# Patient Record
Sex: Female | Born: 1960 | Race: White | Hispanic: No | Marital: Married | Smoking: Never smoker
Health system: Southern US, Community
[De-identification: ages and names within clinical notes are randomized; demographics above are authoritative.]

## PROBLEM LIST (undated history)

## (undated) DIAGNOSIS — R32 Unspecified urinary incontinence: Secondary | ICD-10-CM

## (undated) DIAGNOSIS — K2100 Gastro-esophageal reflux disease with esophagitis, without bleeding: Secondary | ICD-10-CM

## (undated) DIAGNOSIS — I10 Essential (primary) hypertension: Secondary | ICD-10-CM

## (undated) DIAGNOSIS — R7309 Other abnormal glucose: Secondary | ICD-10-CM

## (undated) DIAGNOSIS — E119 Type 2 diabetes mellitus without complications: Secondary | ICD-10-CM

## (undated) DIAGNOSIS — D219 Benign neoplasm of connective and other soft tissue, unspecified: Secondary | ICD-10-CM

## (undated) DIAGNOSIS — E039 Hypothyroidism, unspecified: Secondary | ICD-10-CM

## (undated) DIAGNOSIS — Z85828 Personal history of other malignant neoplasm of skin: Secondary | ICD-10-CM

## (undated) DIAGNOSIS — N814 Uterovaginal prolapse, unspecified: Secondary | ICD-10-CM

## (undated) DIAGNOSIS — E785 Hyperlipidemia, unspecified: Secondary | ICD-10-CM

## (undated) DIAGNOSIS — Z8589 Personal history of malignant neoplasm of other organs and systems: Secondary | ICD-10-CM

## (undated) DIAGNOSIS — IMO0002 Reserved for concepts with insufficient information to code with codable children: Secondary | ICD-10-CM

## (undated) HISTORY — DX: Uterovaginal prolapse, unspecified: N81.4

## (undated) HISTORY — DX: Type 2 diabetes mellitus without complications: E11.9

## (undated) HISTORY — PX: BILATERAL SALPINGECTOMY: SHX5743

## (undated) HISTORY — DX: Benign neoplasm of connective and other soft tissue, unspecified: D21.9

## (undated) HISTORY — DX: Other abnormal glucose: R73.09

## (undated) HISTORY — DX: Hyperlipidemia, unspecified: E78.5

## (undated) HISTORY — DX: Hypothyroidism, unspecified: E03.9

## (undated) HISTORY — DX: Essential (primary) hypertension: I10

## (undated) HISTORY — DX: Unspecified urinary incontinence: R32

## (undated) HISTORY — DX: Gastro-esophageal reflux disease with esophagitis: K21.0

## (undated) HISTORY — DX: Reserved for concepts with insufficient information to code with codable children: IMO0002

## (undated) HISTORY — DX: Gastro-esophageal reflux disease with esophagitis, without bleeding: K21.00

---
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
## 1999-08-05 ENCOUNTER — Other Ambulatory Visit: Admission: RE | Admit: 1999-08-05 | Discharge: 1999-08-05 | Payer: Self-pay | Admitting: Obstetrics and Gynecology

## 2000-09-15 ENCOUNTER — Other Ambulatory Visit: Admission: RE | Admit: 2000-09-15 | Discharge: 2000-09-15 | Payer: Self-pay | Admitting: Obstetrics and Gynecology

## 2001-09-21 ENCOUNTER — Other Ambulatory Visit: Admission: RE | Admit: 2001-09-21 | Discharge: 2001-09-21 | Payer: Self-pay | Admitting: Obstetrics and Gynecology

## 2002-11-28 ENCOUNTER — Other Ambulatory Visit: Admission: RE | Admit: 2002-11-28 | Discharge: 2002-11-28 | Payer: Self-pay | Admitting: Obstetrics and Gynecology

## 2003-06-02 DIAGNOSIS — Z85828 Personal history of other malignant neoplasm of skin: Secondary | ICD-10-CM

## 2003-06-02 HISTORY — DX: Personal history of other malignant neoplasm of skin: Z85.828

## 2004-08-27 ENCOUNTER — Ambulatory Visit: Payer: Self-pay | Admitting: Internal Medicine

## 2005-01-08 ENCOUNTER — Ambulatory Visit: Payer: Self-pay | Admitting: Internal Medicine

## 2006-01-14 ENCOUNTER — Ambulatory Visit: Payer: Self-pay | Admitting: Internal Medicine

## 2006-01-26 ENCOUNTER — Ambulatory Visit: Payer: Self-pay | Admitting: Internal Medicine

## 2006-06-01 DIAGNOSIS — R739 Hyperglycemia, unspecified: Secondary | ICD-10-CM

## 2006-06-01 DIAGNOSIS — R7309 Other abnormal glucose: Secondary | ICD-10-CM

## 2006-06-01 HISTORY — DX: Other abnormal glucose: R73.09

## 2006-06-01 HISTORY — DX: Hyperglycemia, unspecified: R73.9

## 2007-02-21 ENCOUNTER — Ambulatory Visit: Payer: Self-pay | Admitting: Internal Medicine

## 2007-02-24 ENCOUNTER — Ambulatory Visit: Payer: Self-pay | Admitting: Internal Medicine

## 2007-08-02 DIAGNOSIS — Z8589 Personal history of malignant neoplasm of other organs and systems: Secondary | ICD-10-CM

## 2007-08-02 HISTORY — DX: Personal history of malignant neoplasm of other organs and systems: Z85.89

## 2007-11-08 ENCOUNTER — Encounter: Payer: Self-pay | Admitting: Obstetrics and Gynecology

## 2007-11-08 HISTORY — PX: VAGINAL HYSTERECTOMY: SUR661

## 2007-11-09 ENCOUNTER — Inpatient Hospital Stay (HOSPITAL_COMMUNITY): Admission: AD | Admit: 2007-11-09 | Discharge: 2007-11-10 | Payer: Self-pay | Admitting: Urology

## 2008-02-08 ENCOUNTER — Ambulatory Visit: Payer: Self-pay | Admitting: Internal Medicine

## 2008-02-27 ENCOUNTER — Ambulatory Visit: Payer: Self-pay | Admitting: Internal Medicine

## 2009-03-04 ENCOUNTER — Ambulatory Visit: Payer: Self-pay | Admitting: Internal Medicine

## 2010-03-06 ENCOUNTER — Ambulatory Visit: Payer: Self-pay | Admitting: Internal Medicine

## 2010-03-18 ENCOUNTER — Ambulatory Visit: Payer: Self-pay | Admitting: Internal Medicine

## 2010-03-28 ENCOUNTER — Ambulatory Visit: Payer: Self-pay | Admitting: Internal Medicine

## 2010-04-09 ENCOUNTER — Ambulatory Visit: Payer: Self-pay | Admitting: Internal Medicine

## 2010-10-14 NOTE — H&P (Signed)
NAME:  Carmen [name], Carmen [name] NO.:  [id number]   MEDICAL RECORD NO.:  [id number]          PATIENT TYPE:  INP   LOCATION:  1421                         FACILITY:  [facility]   PHYSICIAN:  Martina Sinner, MD DATE OF BIRTH:  1961/02/07   DATE OF ADMISSION:  11/08/2007  DATE OF DISCHARGE:                              HISTORY & PHYSICAL   ADMISSION DIAGNOSIS:  Pelvic organ prolapse; cystocele and vault  suspension; postoperative retention and fatigue.   HISTORY:  Carmen [name] has pelvic organ prolapse.  She had an uneventful  transvaginal hysterectomy, vault suspension and cystocele repair.  She  did very well from a surgical standpoint.  She looked good the morning  after surgery.  Her hemoglobin was normal.  Her vitals were normal.  She  had good urine output.  She had a failed trial of voiding and had  abdominal discomfort that was relieved when a catheter was placed.  She  was scanned and catheterized for 1100 mL.   She had a normal abdominal examination post-catheter insertion.  Incision looked fine.  She is not toxic.  She looks very fatigued and I  do not think she has a high pain tolerance.  I think she is also little  bit anxious and I thought it was best to keep her in for a day of  observation.   PHYSICAL EXAMINATION:  VITAL SIGNS:  Normal, pulse regular.  SKIN:  Warm and dry.  No rashes.  RESPIRATORY:  Breath is quiet.  ABDOMEN:  Soft, nontender.  GU:  Foley catheter in place draining well and clear urine.  NEUROLOGIC:  Normal sensation to touch.  MUSCULOSKELETAL:  Normal leg and motor strength.  She is walking with  small steps with the help of the nurse.  LYMPHATICS:  No inguinal adenopathy.   PLAN:  Carmen [name] was admitted for 24 hours more of observation.  I  suspect she will go home tomorrow.  I kept her IV in at 70 cc an hour.  She will go home on ciprofloxacin and Vicodin.  She will follow up on  Monday for a trial of voiding.     ______________________________  Martina Sinner, MD  Electronically Signed     SAM/MEDQ  D:  11/09/2007  T:  11/09/2007  Job:  [id number]

## 2010-10-14 NOTE — Op Note (Signed)
NAME:  Carmen, [name] NO.:  [id number]   MEDICAL RECORD NO.:  [id number]          PATIENT TYPE:  AMB   LOCATION:  DAY                          FACILITY:  [facility]   PHYSICIAN:  Cynthia P. Romine, M.D.DATE OF BIRTH:  01-07-1961   DATE OF PROCEDURE:  11/08/2007  DATE OF DISCHARGE:                               OPERATIVE REPORT   PREOPERATIVE DIAGNOSIS:  Pelvic prolapse.   POSTOPERATIVE DIAGNOSIS:  Pelvic prolapse.   PROCEDURE:  Total vaginal hysterectomy.   SURGEON:  Cynthia P. Romine, M.D.   ASSISTANT:  Dr. Leda Quail.   ANESTHESIA:  General endotracheal.   ESTIMATED BLOOD LOSS:  150 mL.   COMPLICATIONS:  None.   PROCEDURE:  The patient was taken to the operating room and, after  induction of adequate general anesthesia, was placed in dorsal lithotomy  position and prepped and draped in usual fashion.  The bladder was  drained with a latex-free catheter.  A posterior weighted and anterior  Sims retractor were placed.  The cervix was grasped on its anterior lip  with a single-tooth tenaculum.  The cervix did protrude out through the  introitus with traction on the cervix with the tenaculum.  The mucosa  overlying the cervix was injected with 1% Xylocaine with epinephrine  approximately 6 mL.  A knife was used to incise the mucosa off the  cervix from 9 o'clock to 3 o'clock that was pushed anteriorly.  Next  attention was turned posteriorly.  A posterior colpotomy incision was  made and the Bonanno retractor placed.  Uterosacral ligaments were  clamped, cut and held bilaterally.  The hysterectomy continued up the  cardinal ligament, clamping, cutting and tying in sequence.  The  anterior peritoneum was identified and entered atraumatically and Deaver  retractor was placed into the anterior peritoneal space.  Proper  placement was reaffirmed by noting bowel visualized.  The pedicle  containing the uterine arteries was clamped, cut and tied bilaterally.  One more pedicle was taken up the broad ligament and then the pedicle  containing the utero-ovarian tube and round ligament was clamped, cut  and doubly tied on both sides.  Specimens removed and sent to pathology.  The pedicles were inspected and  were felt to be free of bleeding.  The  ovaries were inspected and were also normal.  There was a small,  approximately 1 cm, cyst on the left ovary that appeared functional.  The cuff was then run with 0 Vicryl from 1 o'clock to 11 o'clock,  incorporated vaginal cuff and the peritoneum.  An anti-enterocele stitch  was placed, ligating the uterosacral ligaments together in the midline  posteriorly and then Dr. Lorin Picket  MacDiarmid came in to perform the surgery for the bladder prolapse.  This will be dictated separately.  After he completed the bladder  repair, I did close the vaginal cuff with several figure-of-eight  sutures of 0 Vicryl and good hemostasis was noted.      Cynthia P. Romine, M.D.  Electronically Signed     CPR/MEDQ  D:  11/08/2007  T:  11/08/2007  Job:  [id number]

## 2010-10-14 NOTE — Op Note (Signed)
NAME:  Carmen [name], Carmen [name] NO.:  [id number]   MEDICAL RECORD NO.:  [id number]          PATIENT TYPE:  AMB   LOCATION:  DAY                          FACILITY:  [facility]   PHYSICIAN:  Martina Sinner, MD DATE OF BIRTH:  Oct 21, 1960   DATE OF PROCEDURE:  11/08/2007  DATE OF DISCHARGE:                               OPERATIVE REPORT   PREOPERATIVE DIAGNOSES:  Uterine vault prolapse, plus cystocele, plus  rectocele plus stress incontinence.   POSTOPERATIVE DIAGNOSES:  Uterine vault prolapse, plus cystocele, plus  rectocele plus stress incontinence.   SURGERY:  Transvaginal vault suspension, plus cystocele repair, plus  graft, plus sling, cystourethropexy (bracket SPARC) plus cystoscopy.   PRIMARY SURGEON:  Dr. Lorin Picket MacDiarmid   ASSISTANT:  Dr. Purvis Sheffield.   Transvaginal hysterectomy performed by Dr. Tresa Res and assistant Dr.  Leda Quail.   Carmen [name] has vaginal vault prolapse, uterine prolapse with a large  cystocele.  She consented to the above surgery.  She underwent a  transvaginal hysterectomy by Dr. Tresa Res and Dr. Hyacinth Meeker.  The cuff was  opened when I entered the operating room.  Blood loss was minimal.  The  posterior cuff had been run with absorbable suture.   Her vaginal cuff descended to 2-3 cm from the introitus.  I placed 2  Allis clamps on the cuff lateral to the midline and instilled 20 mL of a  lidocaine/epinephrine mixture submucosally all the way to the pelvic  sidewall even to the level of the urethrovesical angle.  I made a long  anterior midline incision, making the incision thicker underlying the  urethra where the synthetic sling would be placed.  I sharply dissected  the vaginal mucosa from the underlying pubocervical fascia and also used  blunt dissection to reach the endopelvic fascia bilaterally.  There was  very minimal bleeding.  I did an anterior repair with running 2-0 Vicryl  suture, not encroaching on the bladder neck.  I then  cystoscoped the  patient, and there was efflux of indigo carmine from both ureters, a  nice cystocele reduction, and no distortion of anatomy or injury to the  bladder or urethra.   I made two 1 cm incisions 1 fingerbreadth above the symphysis pubis 1.5  cm lateral to the midline.  With the bladder emptied, I passed the [facility]  needle on top of and along the back of the symphysis pubis parallel to  the midline on the pulp of my index finger bilaterally at the level of  the urethrovesical angle.  I then cystoscoped the patient.  There was no  injury to the bladder or urethra.  There was no movement of the bladder  with deflection of the needle.  There was bilateral efflux of indigo  carmine.   With the bladder emptied, the sling was attached and brought up through  the retropubic space, and it was left in situ with the sheaths in place.   With the bladder empty, I then finger-dissected to the ischial spine  bilaterally.  She had prominent spines bilaterally.  The sacrospinous  ligament  was easily palpable, especially on her left side and less so on  the right side.  I used the cath healing 0 Ethibond suture and placed a  suture approximately 1 cm medial and inferior and cephalad to the  ischial spine bilaterally.  I was very pleased with the position of the  suture, and I checked it on a number of occasions.  There was little to  no bleeding.   I placed 2 sutures in the pelvic sidewall, approaching the  urethrovesical angle but not overlapping the sling of 0 Ethibond using a  CT-2 needle.  I cut a 10 x six dermal sling that was soaked with saline  to the shape of a trapezoid.  It was sewn to the 4 sutures using an  empty needle driver and also a Mayo needle.  The dermis laid in very  nicely under appropriate tension and did not encroach the sling.   The sling was tensioned over the fat part of the large Kelly clamp, and  I placed it on the looser side because it was in the proximal  third of  urethra which it usually is when one opens up the entire anterior  vaginal wall.  She also has mild stress incontinence and primary urge  incontinence.   I trimmed approximately 8 mm of anterior vaginal wall mucosa from both  sides and closed the anterior vaginal wall with running 2-0 Vicryl on a  CT-1 needle all the way back to the cuff.   Dr. Tresa Res then closed the cuff with 4 interrupted figure-of-eight 0  Vicryl sutures.   We irrigated the vagina and checked the repair.  She had excellent  vaginal length.  She had very good reduction of the cystocele.  She had  a flat posterior vaginal wall and very minimal rectocele on digital  rectal examination.  There was no injury or suture in the rectum.   Two vaginal packs with Estrace cream were firmly packed in the vagina.  Estimated blood loss for the vault suspension was approximately 100 mL.  Leg position was good.  Catheter was draining blue urine at the end of  the case.   I cut the sling below the skin and closed the skin with 4-0 Monocryl  followed by Dermabond.   I was very pleased with Ms. Raz' surgery.  I hope we will reach her  treatment goal and she will get a good durable response in regard to her  prolapse as well as her incontinence.  I am a bit concerned about her  overactive bladder.           ______________________________  Martina Sinner, MD  Electronically Signed     SAM/MEDQ  D:  11/08/2007  T:  11/08/2007  Job:  [id number]

## 2010-11-17 ENCOUNTER — Ambulatory Visit: Payer: Self-pay | Admitting: Internal Medicine

## 2011-02-26 LAB — COMPREHENSIVE METABOLIC PANEL
ALT: 19
AST: 21
Alkaline Phosphatase: 46
CO2: 33 — ABNORMAL HIGH
Calcium: 9.5
GFR calc Af Amer: >60
Potassium: 3.9
Sodium: 138
Total Protein: 7.6

## 2011-02-26 LAB — CBC
HCT: 32.9 — ABNORMAL LOW
Hemoglobin: 11.6 — ABNORMAL LOW
MCHC: 35.8
Platelets: 201
RBC: 3.8 — ABNORMAL LOW
RBC: 4.48
RDW: 13.1
WBC: 11.1 — ABNORMAL HIGH
WBC: 7

## 2011-02-26 LAB — PREGNANCY, URINE: Preg Test, Ur: NEGATIVE

## 2011-02-26 LAB — TYPE AND SCREEN
ABO/RH(D): O NEG
Antibody Screen: NEGATIVE

## 2011-02-26 LAB — ABO/RH: ABO/RH(D): O NEG

## 2011-04-03 ENCOUNTER — Emergency Department: Payer: Self-pay | Admitting: Emergency Medicine

## 2011-04-07 ENCOUNTER — Ambulatory Visit: Payer: Self-pay | Admitting: Internal Medicine

## 2011-04-16 ENCOUNTER — Ambulatory Visit: Payer: Self-pay | Admitting: Internal Medicine

## 2011-05-14 ENCOUNTER — Ambulatory Visit: Payer: Self-pay | Admitting: Unknown Physician Specialty

## 2011-09-01 ENCOUNTER — Ambulatory Visit: Payer: Self-pay | Admitting: Internal Medicine

## 2011-09-30 ENCOUNTER — Ambulatory Visit: Payer: Self-pay | Admitting: Internal Medicine

## 2011-10-31 ENCOUNTER — Ambulatory Visit: Payer: Self-pay | Admitting: Internal Medicine

## 2012-04-19 ENCOUNTER — Ambulatory Visit: Payer: Self-pay | Admitting: Family Medicine

## 2012-10-11 ENCOUNTER — Encounter: Payer: Self-pay | Admitting: Obstetrics and Gynecology

## 2012-10-12 ENCOUNTER — Ambulatory Visit (INDEPENDENT_AMBULATORY_CARE_PROVIDER_SITE_OTHER): Payer: Managed Care, Other (non HMO) | Admitting: Obstetrics and Gynecology

## 2012-10-12 ENCOUNTER — Encounter: Payer: Self-pay | Admitting: Obstetrics and Gynecology

## 2012-10-12 VITALS — BP 110/70 | HR 64 | Resp 16 | Ht 64.5 in | Wt 132.0 lb

## 2012-10-12 DIAGNOSIS — Z0000 Encounter for general adult medical examination without abnormal findings: Secondary | ICD-10-CM

## 2012-10-12 DIAGNOSIS — Z01419 Encounter for gynecological examination (general) (routine) without abnormal findings: Secondary | ICD-10-CM

## 2012-10-12 LAB — POCT URINALYSIS DIPSTICK
Bilirubin, UA: NEGATIVE
Glucose, UA: NEGATIVE
Ketones, UA: NEGATIVE

## 2012-10-12 NOTE — Patient Instructions (Signed)

## 2012-10-12 NOTE — Progress Notes (Signed)
52 y.o.  Married  Caucasian female   G1P1001 here for annual exam.  A1C's running about 5.4.  Pt walking for exercise.   No LMP recorded. Patient has had a hysterectomy.          Sexually active: yes  The current method of family planning is none.    Exercising: yes Last mammogram:04/19/12 normal   Last pap smear:11/28/02 History of abnormal pap: no Smoking:no Alcohol:no Last colonoscopy:05/2011 normal recheck in 5 years due to Fhx Last Bone Density: n/a  Last tetanus shot:2010 (pt unsure) Last cholesterol check: 12/2011   Hgb: 13.1               Urine: Normal    Health Maintenance  Topic Date Due  . Pap Smear  03/31/1979  . Mammogram  03/31/2011  . Colonoscopy  03/31/2011  . Tetanus/tdap  06/02/2011  . Influenza Vaccine  01/30/2013    No family history on file.  There are no active problems to display for this patient.   Past Medical History  Diagnosis Date  . Uterine prolapse   . Cystocele   . Urinary incontinence   . Diabetes   . Hyperlipidemia   . Blood sugar increased 2008  . Hypothyroid     Hashimoto's  thyroid  . Hypertension   . Fibroid   . Uterine prolapse     Past Surgical History  Procedure Laterality Date  . Vaginal hysterectomy  11/08/07    Total; Cystocele repair; valut suspension. adenomycosis fibroid  . Bilateral salpingectomy      Allergies: Atenolol and Latex  Current Outpatient Prescriptions  Medication Sig Dispense Refill  . calcium carbonate (OS-CAL) 600 MG TABS Take 600 mg by mouth 2 (two) times daily with a meal.      . esomeprazole (NEXIUM) 40 MG packet Take 40 mg by mouth daily before breakfast.      . fexofenadine (ALLEGRA) 180 MG tablet Take 180 mg by mouth daily.      . fluticasone (FLONASE) 50 MCG/ACT nasal spray Place 2 sprays into the nose daily.      Marland Kitchen levothyroxine (LEVOXYL) 50 MCG tablet Take 50 mcg by mouth daily before breakfast.      . losartan (COZAAR) 50 MG tablet Take 50 mg by mouth daily.      . metFORMIN  (GLUCOPHAGE) 500 MG tablet Take 500 mg by mouth 2 (two) times daily with a meal.      . Multiple Vitamins-Minerals (MULTIVITAMIN PO) Take by mouth.      . Omega-3 Fatty Acids (FISH OIL) 1000 MG CAPS Take by mouth.      . triamterene-hydrochlorothiazide (MAXZIDE-25) 37.5-25 MG per tablet Take 1 tablet by mouth daily.       No current facility-administered medications for this visit.    ROS: Pertinent items are noted in HPI.  Social Hx:  Married, one son Fayrene Fearing in college at [employer].    Exam:    BP 110/70  Pulse 64  Resp 16  Ht 5' 4.5" (1.638 m)  Wt 132 lb (59.875 kg)  BMI 22.32 kg/m2Down 4 pounds from last year   Wt Readings from Last 3 Encounters:  10/12/12 132 lb (59.875 kg)     Ht Readings from Last 3 Encounters:  10/12/12 5' 4.5" (1.638 m)    General appearance: alert, cooperative and appears stated age Head: Normocephalic, without obvious abnormality, atraumatic Neck: no adenopathy, supple, symmetrical, trachea midline and thyroid not enlarged, symmetric, no tenderness/mass/nodules Lungs:  clear to auscultation bilaterally Breasts: Inspection negative, No nipple retraction or dimpling, No nipple discharge or bleeding, No axillary or supraclavicular adenopathy, Normal to palpation without dominant masses Heart: regular rate and rhythm Abdomen: soft, non-tender; bowel sounds normal; no masses,  no organomegaly Extremities: extremities normal, atraumatic, no cyanosis or edema Skin: Skin color, texture, turgor normal. No rashes or lesions Lymph nodes: Cervical, supraclavicular, and axillary nodes normal. No abnormal inguinal nodes palpated Neurologic: Grossly normal   Pelvic: External genitalia:  no lesions              Urethra:  normal appearing urethra with no masses, tenderness or lesions              Bartholins and Skenes: normal                 Vagina: normal appearing vagina with normal color and discharge, no lesions, Grade 1 cystocele has recurred               Cervix: absent              Pap taken: no        Bimanual Exam:  Uterus:  absent                                      Adnexa: normal adnexa in size, nontender and no masses                                      Rectovaginal: Confirms                                      Anus:  normal sphincter tone, no lesions  A: normal gyn exam     S/p TVH, cystocele repair, vault suspension 2009; mild cystocele on today's exam, pt a-[id number]     AODM, HTN, elevated TG     hypothyroids post Hashimoto's thyroiditis     P: mammogram counseled on breast self exam, mammography screening, adequate intake of calcium and vitamin D, diet and exercise return annually or prn     An After Visit Summary was printed and given to the patient.

## 2012-10-13 LAB — HEMOGLOBIN, FINGERSTICK: Hemoglobin, fingerstick: 13.1 g/dL (ref 12.0–16.0)

## 2013-01-09 ENCOUNTER — Telehealth: Payer: Self-pay | Admitting: Obstetrics and Gynecology

## 2013-01-09 NOTE — Telephone Encounter (Signed)
Patient is having hot flashes .

## 2013-01-09 NOTE — Telephone Encounter (Signed)
Patient is having hot flashes, would like to talk to a nurse.

## 2013-01-09 NOTE — Telephone Encounter (Signed)
Spoke with pt about hot flashes that have gotten much worse over the last few weeks. Up at night sometimes every hour. Pt states, "Dr. Tresa Res said I would know when I needed to call her." Sched consult tomorrow at 10:30.

## 2013-01-10 ENCOUNTER — Ambulatory Visit (INDEPENDENT_AMBULATORY_CARE_PROVIDER_SITE_OTHER): Payer: Managed Care, Other (non HMO) | Admitting: Obstetrics and Gynecology

## 2013-01-10 ENCOUNTER — Encounter: Payer: Self-pay | Admitting: Obstetrics and Gynecology

## 2013-01-10 VITALS — BP 132/80 | Ht 64.25 in | Wt 129.0 lb

## 2013-01-10 DIAGNOSIS — N951 Menopausal and female climacteric states: Secondary | ICD-10-CM

## 2013-01-10 MED ORDER — ESTRADIOL 0.1 MG/24HR TD PTTW: 1.0000 | MEDICATED_PATCH | TRANSDERMAL | Status: DC

## 2013-01-10 NOTE — Progress Notes (Signed)
52 yo MWF G1P1 who over the past several months has had increasing hot flashes and night sweats to the point that she had a hot flash last night every hour and couldn't sleep almost at all.  Pt is exhausted and really wants help.  Last check on diabetes and thyroid was in Feb and both were good.  Pt has had a hysterectomy.    Discussed risks and benefits of ERT with pt, and I rec that in her situation at her age and with such bad symptoms, I believe ERT is safe and in her best interest.  Risks of blood clotting, breast and ovarian cancer discussed.  Pt accepts and wishes to start.  Educated in Minivelle 0.1 mg 2 times per week and rx sent.  Questions invited and answered.

## 2013-01-10 NOTE — Patient Instructions (Signed)
Begin Minivelle as directed, twice a week.  Keep appointment for routine care.  Return sooner is symptoms do not improve with treatment.

## 2013-03-10 ENCOUNTER — Ambulatory Visit: Payer: Self-pay | Admitting: Family Medicine

## 2013-04-20 ENCOUNTER — Ambulatory Visit: Payer: Self-pay | Admitting: Family Medicine

## 2013-09-08 ENCOUNTER — Encounter: Payer: Self-pay | Admitting: Obstetrics and Gynecology

## 2013-09-08 ENCOUNTER — Ambulatory Visit (INDEPENDENT_AMBULATORY_CARE_PROVIDER_SITE_OTHER): Payer: Managed Care, Other (non HMO) | Admitting: Obstetrics and Gynecology

## 2013-09-08 VITALS — BP 100/64 | HR 70 | Ht 64.5 in | Wt 139.2 lb

## 2013-09-08 DIAGNOSIS — K219 Gastro-esophageal reflux disease without esophagitis: Secondary | ICD-10-CM

## 2013-09-08 DIAGNOSIS — R1013 Epigastric pain: Secondary | ICD-10-CM

## 2013-09-08 DIAGNOSIS — N951 Menopausal and female climacteric states: Secondary | ICD-10-CM

## 2013-09-08 DIAGNOSIS — K3189 Other diseases of stomach and duodenum: Secondary | ICD-10-CM

## 2013-09-08 NOTE — Progress Notes (Signed)
Patient ID: Carmen [name], female   DOB: 08/23/1960, 53 y.o.   MRN: [id number] GYNECOLOGY VISIT  PCP:   [place] Pink, MD  Referring provider:   HPI: 53 y.o.   Married  Caucasian  female   G1P1001 with No LMP recorded. Patient has had a hysterectomy.   here consult regarding HRT.   Had consultation for severe hot flashes last summer Started Minivelle patch 0.1 mg twice weekly.  In October developed severe reflux.  Saw PCP.  Started dexilant. Did endoscopy with GI. H Pylori was negative.  Went to [facility] for a second opinion. Was severe enough to cause hoarseness of voice.   In December cut the [facility] in half and did not have hot flashes but did not get relief of reflux.   Stopped Minivelle in March, off now for one month. Reflux has improved somewhat.  No resumption of hot flashes.   GYNECOLOGIC HISTORY: No LMP recorded. Patient has had a hysterectomy. Sexually active:  yes Partner preference: female Contraception: postmenopausal   Menopausal hormone therapy: no DES exposure:   no Blood transfusions:  no  Sexually transmitted diseases:   no GYN procedures and prior surgeries: TVH/cystocele repair 2009.  Last mammogram:  04/2013 LEX:[id number]               Last pap and high risk HPV testing: 11-19-02 wnl   History of abnormal pap smear:  no   OB History   Grav Para Term Preterm Abortions TAB SAB Ect Mult Living   1 1 1       1        LIFESTYLE: Exercise: walking              Tobacco: no Alcohol:    no Drug use:  no  There are no active problems to display for this patient.   Past Medical History  Diagnosis Date  . Uterine prolapse   . Cystocele   . Urinary incontinence   . Diabetes   . Hyperlipidemia   . Blood sugar increased 2008  . Hypothyroid     Hashimoto's  thyroid  . Hypertension   . Fibroid   . Uterine prolapse   . Reflux esophagitis     Past Surgical History  Procedure Laterality Date  . Vaginal hysterectomy  11/08/07     Total; Cystocele repair; valut suspension. adenomycosis fibroid  . Bilateral salpingectomy      Current Outpatient Prescriptions  Medication Sig Dispense Refill  . calcium carbonate (OS-CAL) 600 MG TABS Take 600 mg by mouth 2 (two) times daily with a meal.      . fexofenadine (ALLEGRA) 180 MG tablet Take 180 mg by mouth daily.      . fluticasone (FLONASE) 50 MCG/ACT nasal spray Place 2 sprays into the nose daily.      Marland Kitchen levothyroxine (LEVOXYL) 50 MCG tablet Take 50 mcg by mouth daily before breakfast.      . losartan (COZAAR) 50 MG tablet Take 50 mg by mouth daily.      . metFORMIN (GLUCOPHAGE) 500 MG tablet Take 500 mg by mouth 2 (two) times daily with a meal.      . Multiple Vitamins-Minerals (MULTIVITAMIN PO) Take by mouth.      . triamterene-hydrochlorothiazide (MAXZIDE-25) 37.5-25 MG per tablet Take 1 tablet by mouth daily.      Marland Kitchen estradiol (MINIVELLE) 0.1 MG/24HR patch Place 1 patch (0.1 mg total) onto the skin 2 (two) times a week. Apply anywhere on  lower abdomen.  Change patch on Sun am and Wed pm.  24 patch  3   No current facility-administered medications for this visit.     ALLERGIES: Atenolol and Latex  Family History  Problem Relation Age of Onset  . Thyroid disease Mother   . Diabetes Father   . Hypertension Father   . Stroke Father     History   Social History  . Marital Status: Married    Spouse Name: N/A    Number of Children: N/A  . Years of Education: N/A   Occupational History  . Not on file.   Social History Main Topics  . Smoking status: Never Smoker   . Smokeless tobacco: Never Used  . Alcohol Use: No  . Drug Use: No  . Sexual Activity: Yes    Partners: Male    Birth Control/ Protection: Post-menopausal   Other Topics Concern  . Not on file   Social History Narrative  . No narrative on file    ROS:  Pertinent items are noted in HPI.  PHYSICAL EXAMINATION:    BP 100/64  Pulse 70  Ht 5' 4.5" (1.638 m)  Wt 139 lb 3.2 oz (63.141  kg)  BMI 23.53 kg/m2   Wt Readings from Last 3 Encounters:  09/08/13 139 lb 3.2 oz (63.141 kg)  01/10/13 129 lb (58.514 kg)  10/12/12 132 lb (59.875 kg)     Ht Readings from Last 3 Encounters:  09/08/13 5' 4.5" (1.638 m)  01/10/13 5' 4.25" (1.632 m)  10/12/12 5' 4.5" (1.638 m)    General appearance: alert, cooperative and appears stated age   ASSESSMENT  Menopausal symptoms. Dyspepsia on transdermal estrogen therapy.   PLAN  Patient and I reviewed potential gastrointestinal symptoms of ERT and dyspepsia is listed, although not a common side effect observed. She will stop the estrogen completely. We discussed herbal remedies, which she will pursue.  We discussed Brisdelle and Effexor as options if her symptoms are not controlled with complementary medicine options.   An After Visit Summary was printed and given to the patient.  25 minutes face to face of which over 50% was spent in counseling.

## 2013-09-08 NOTE — Patient Instructions (Signed)
Paroxetine capsules What is this medicine? PAROXETINE (pa ROX e teen) is used to treat hot flashes due to menopause. This medicine may be used for other purposes; ask your health care provider or pharmacist if you have questions. COMMON BRAND NAME(S): Brisdelle What should I tell my health care provider before I take this medicine? They need to know if you have any of these conditions: -bleeding disorders -glaucoma -heart disease -kidney disease -liver disease -low levels of sodium in the blood -mania or bipolar disorder -seizures -suicidal thoughts, plans, or attempt; a previous suicide attempt by you or a family member -take MAOIs like Carbex, Eldepryl, Marplan, Nardil, and Parnate -take medicines that treat or prevent blood clots -an unusual or allergic reaction to paroxetine, other medicines, foods, dyes, or preservatives -pregnant or trying to get pregnant -breast-feeding How should I use this medicine? Take this medicine by mouth once daily at bedtime. Follow the directions on the prescription label. This medicine can be taken with or without food. Take your medicine at regular intervals. Do not take your medicine more often than directed. A special MedGuide will be given to you by the pharmacist with each prescription and refill. Be sure to read this information carefully each time. Overdosage: If you think you've taken too much of this medicine contact a poison control center or emergency room at once. Overdosage: If you think you have taken too much of this medicine contact a poison control center or emergency room at once. NOTE: This medicine is only for you. Do not share this medicine with others. What if I miss a dose? If you miss a dose, take it as soon as you can. If it is almost time for your next dose, take only that dose. Do not take double or extra doses. What may interact with this medicine? Do not take this medicine with any of the following  medications: -linezolid -MAOIs like Carbex, Eldepryl, Marplan, Nardil, and Parnate -methylene blue (injected into a vein) -pimozide -thioridazine This medicine may also interact with the following medications: -alcohol -aspirin and aspirin-like medicines -atomoxetine -certain medicines for depression, anxiety, or psychotic disturbances -certain medicines for irregular heart beat like propafenone, flecainide, encainide, and quinidine -certain medicines for migraine headache like almotriptan, eletriptan, frovatriptan, naratriptan, rizatriptan, sumatriptan, zolmitriptan -cimetidine -digoxin -diuretics -fentanyl -fosamprenavir -furazolidone -isoniazid -lithium -medicines that treat or prevent blood clots like warfarin, enoxaparin, and dalteparin -medicines for sleep -NSAIDs, medicines for pain and inflammation, like ibuprofen or naproxen -phenobarbital -phenytoin -procarbazine -rasagiline -ritonavir -supplements like St. John's wort, kava kava, valerian -tamoxifen -tramadol -tryptophan This list may not describe all possible interactions. Give your health care provider a list of all the medicines, herbs, non-prescription drugs, or dietary supplements you use. Also tell them if you smoke, drink alcohol, or use illegal drugs. Some items may interact with your medicine. What should I watch for while using this medicine? Tell your doctor or healthcare professional if your symptoms do not start to get better or if they get worse. Visit your doctor or health care professional for regular checks on your progress. Patients and their families should watch out for new or worsening thoughts of suicide or depression. Also watch out for sudden changes in feelings such as feeling anxious, agitated, panicky, irritable, hostile, aggressive, impulsive, severely restless, overly excited and hyperactive, or not being able to sleep. If this happens, especially at the beginning of treatment or after a  change in dose, call your health care professional. You may get drowsy or dizzy. Do   not drive, use machinery, or do anything that needs mental alertness until you know how this medicine affects you. Do not stand or sit up quickly, especially if you are an older patient. This reduces the risk of dizzy or fainting spells. Alcohol may interfere with the effect of this medicine. Avoid alcoholic drinks. Your mouth may get dry. Chewing sugarless gum, sucking hard candy and drinking plenty of water will help. Contact your doctor if the problem does not go away or is severe. Women should inform their doctor if they wish to become pregnant or think they might be pregnant. There is a potential for serious side effects to an unborn child. Talk to your health care professional or pharmacist for more information. Do not become pregnant while taking this medicine. What side effects may I notice from receiving this medicine? Side effects that you should report to your doctor or health care professional as soon as possible: -allergic reactions like skin rash, itching or hives, swelling of the face, lips, or tongue -changes in emotions or moods -confusion -depression -feeling faint or lightheaded, falls -seizures -suicidal thoughts or actions -unusual bleeding or bruising -unusually weak or tired -weakness Side effects that usually do not require medical attention (Report these to your doctor or health care professional if they continue or are bothersome.): -change in sex drive or performance -fatigue -drowsiness -headache -insomnia -nausea/vomiting -upset stomach This list may not describe all possible side effects. Call your doctor for medical advice about side effects. You may report side effects to FDA at 1-800-FDA-1088. Where should I keep my medicine? Keep out of the reach of children. Store at room temperature between 20 and 25 degrees C (68 and 77 degrees F). Throw away any unused medicine after  the expiration date. NOTE: This sheet is a summary. It may not cover all possible information. If you have questions about this medicine, talk to your doctor, pharmacist, or health care provider.  2014, [employer]vier/Gold Standard. (2012-01-18 12:26:17)  Menopause and Herbal Products Menopause is the normal time of life when menstrual periods stop completely. Menopause is complete when you have missed 12 consecutive menstrual periods. It usually occurs between the ages of 77 to 68, with an average age of 74. Very rarely does a woman develop menopause before 53 years old. At menopause, your ovaries stop producing the female hormones, estrogen and progesterone. This can cause undesirable symptoms and also affect your health. Sometimes the symptoms can occur 4 to 5 years before the menopause begins. There is no relationship between menopause and:  Oral contraceptives.  Number of children you had.  Race.  The age your menstrual periods started (menarche). Heavy smokers and very thin women may develop menopause earlier in life. Estrogen and progesterone hormone treatment is the usual method of treating menopausal symptoms. However, there are women who should not take hormone treatment. This is true of:   Women that have breast or uterine cancer.  Women who prefer not to take hormones because of certain side effects (abnormal uterine bleeding).  Women who are afraid that hormones may cause breast cancer.  Women who have a history of liver disease, heart disease, stroke, or blood clots. For these women, there are other medications that may help treat their menopausal symptoms. These medications are found in plants and botanical products. They can be found in the form of herbs, teas, oils, tinctures, and pills.  CAUSES:  The ovaries stop producing the female hormones estrogen and progesterone.  Other causes include:  Surgery  to remove both ovaries.  The ovaries stop functioning for no know  reason.  Tumors of the pituitary gland in the brain.  Medical disease that affects the ovaries and hormone production.  Radiation treatment to the abdomen or pelvis.  Chemotherapy that affects the ovaries. PHYTOESTROGENS: Phytoestrogens occur naturally in plants and plant products. They act like estrogen in the body. Herbal medications are made from these plants and botanical steroids. There are 3 types of phytoestrogens:  Isoflavones (genistein and daidzein) are found in soy, garbanzo beans, miso and tofu foods.  Ligins are found in the shell of seeds. They are used to make oils like flaxseed oil. The bacteria in your intestine act on these foods to produce the estrogen-like hormones.  Coumestans are estrogen-like. Some of the foods they are found in include sunflower seeds and bean sprouts. CONDITIONS AND THEIR POSSIBLE HERBAL TREATMENT:  Hot flashes and night sweats.  Soy, black cohosh and evening primrose.  Irritability, insomnia, depression and memory problems.  Chasteberry, ginseng, and soy.  St. John's wort may be helpful for depression. However, there is a concern of it causing cataracts of the eye and may have bad effects on other medications. St. John's wort should not be taken for long time and without your caregiver's advice.  Loss of libido and vaginal and skin dryness.  Wild yam and soy.  Prevention of coronary heart disease and osteoporosis.  Soy and Isoflavones. Several studies have shown that some women benefit from herbal medications, but most of the studies have not consistently shown that these supplements are much better than placebo. Other forms of treatment to help women with menopausal symptoms include a balanced diet, rest, exercise, vitamin and calcium (with vitamin D) supplements, acupuncture, and group therapy when necessary. THOSE WHO SHOULD NOT TAKE HERBAL MEDICATIONS INCLUDE:  Women who are planning on getting pregnant unless told by your  caregiver.  Women who are breastfeeding unless told by your caregiver.  Women who are taking other prescription medications unless told by your caregiver.  Infants, children, and elderly women unless told by your caregiver. Different herbal medications have different and unmeasured amounts of the herbal ingredients. There are no regulations, quality control, and standardization of the ingredients in herbal medications. Therefore, the amount of the ingredient in the medication may vary from one herb, pill, tea, oil or tincture to another. Many herbal medications can cause serious problems and can even have poisonous effects if taken too much or too long. If problems develop, the medication should be stopped and recorded by your caregiver. HOME CARE INSTRUCTIONS  Do not take or give children herbal medications without your caregiver's advice.  Let your caregiver know all the medications you are taking. This includes prescription, over-the-counter, eye drops, and creams.  Do not take herbal medications longer or more than recommended.  Tell your caregiver about any side effects from the medication. SEEK MEDICAL CARE IF:  You develop a fever of 102 F (38.9 C), or as directed by your caregiver.  You feel sick to your stomach (nauseous), vomit, or have diarrhea.  You develop a rash.  You develop abdominal pain.  You develop severe headaches.  You start to have vision problems.  You feel dizzy or faint.  You start to feel numbness in any part of your body.  You start shaking (have convulsions). Document Released: 11/04/2007 Document Revised: 05/04/2012 Document Reviewed: 06/03/2010 [facility] Patient Information 2014 [place].

## 2013-10-18 ENCOUNTER — Encounter: Payer: Self-pay | Admitting: Obstetrics and Gynecology

## 2013-10-18 ENCOUNTER — Ambulatory Visit (INDEPENDENT_AMBULATORY_CARE_PROVIDER_SITE_OTHER): Payer: Managed Care, Other (non HMO) | Admitting: Obstetrics and Gynecology

## 2013-10-18 ENCOUNTER — Ambulatory Visit: Payer: Managed Care, Other (non HMO) | Admitting: Obstetrics and Gynecology

## 2013-10-18 VITALS — BP 118/64 | HR 62 | Temp 98.2°F | Ht 64.25 in | Wt 129.4 lb

## 2013-10-18 DIAGNOSIS — Z01419 Encounter for gynecological examination (general) (routine) without abnormal findings: Secondary | ICD-10-CM

## 2013-10-18 DIAGNOSIS — Z0000 Encounter for general adult medical examination without abnormal findings: Secondary | ICD-10-CM

## 2013-10-18 LAB — POCT URINALYSIS DIPSTICK
BILIRUBIN UA: NEGATIVE
Blood, UA: NEGATIVE
Glucose, UA: NEGATIVE
KETONES UA: NEGATIVE
LEUKOCYTES UA: NEGATIVE
Nitrite, UA: NEGATIVE
PH UA: 6.5
PROTEIN UA: NEGATIVE
Urobilinogen, UA: NEGATIVE

## 2013-10-18 LAB — HEMOGLOBIN, FINGERSTICK: Hemoglobin, fingerstick: 12.9 g/dL (ref 12.0–16.0)

## 2013-10-18 MED ORDER — ESTRADIOL 10 MCG VA TABS: 1.0000 | ORAL_TABLET | VAGINAL | Status: DC

## 2013-10-18 MED ORDER — ESTRADIOL 0.025 MG/24HR TD PTWK: 0.0250 mg | MEDICATED_PATCH | TRANSDERMAL | Status: DC

## 2013-10-18 NOTE — Patient Instructions (Signed)

## 2013-10-18 NOTE — Progress Notes (Signed)
Patient ID: TIAUNNA BUFORD, female   DOB: Apr 08, 1961, 53 y.o.   MRN: [id number]  GYNECOLOGY VISIT  PCP:   Referring provider:   HPI: 53 y.o.   Married  Caucasian  female   G1P1001 with No LMP recorded. Patient has had a hysterectomy.   here for annual exam.   Stopped the estrogen patch and now feels back to normal.  Lots of hot flashes and sex is painful.  Estroven not helpful.  Not interested in Nisswa or Effexor.  Wants to try a lower dose of estrogen patch even if it means a return to reflux symptoms.   Occasional leakage of urine.   Hgb:  12.9 Urine:  Normal.   GYNECOLOGIC HISTORY: No LMP recorded. Patient has had a hysterectomy. Sexually active:  Yes  Partner preference:  Female Contraception:   NA Menopausal hormone therapy:  None currently. GYN procedures and prior surgeries:  TVH, Cystocele repair Last mammogram:    November or December 2014 - Jud. Goes to PCP.           Last pap and high risk HPV testing:   11/19/02 wnl. History of abnormal pap smear:  no   OB History   Grav Para Term Preterm Abortions TAB SAB Ect Mult Living   1 1 1       1        LIFESTYLE: Exercise:  Walking.          Tobacco: no  Alcohol:  no Drug use:  no  OTHER HEALTH MAINTENANCE: Tetanus/TDap:  PCP ? 2013.  Gardisil:  NA  Influenza:  Yes.  Zostavax:  NA  Bone density:  November 2014 - PCP - normal.  Colonoscopy: Done in December 2012 - normal.    Cholesterol check:  PCP.    Family History  Problem Relation Age of Onset  . Thyroid disease Mother   . Diabetes Father   . Hypertension Father   . Stroke Father   . Colon cancer Other     Patient Active Problem List   Diagnosis Date Noted  . Gastro-esophageal reflux 09/08/2013   Past Medical History  Diagnosis Date  . Uterine prolapse   . Cystocele   . Urinary incontinence   . Diabetes   . Hyperlipidemia   . Blood sugar increased 2008  . Hypothyroid     Hashimoto's  thyroid  . Hypertension   .  Fibroid   . Uterine prolapse   . Reflux esophagitis     Past Surgical History  Procedure Laterality Date  . Vaginal hysterectomy  11/08/07    Total; Cystocele repair; valut suspension. adenomycosis fibroid  . Bilateral salpingectomy      ALLERGIES: Atenolol and Latex  Current Outpatient Prescriptions  Medication Sig Dispense Refill  . Calcium Carbonate (CALTRATE 600 PO) Take 600 mg by mouth daily.      . fexofenadine (ALLEGRA) 180 MG tablet Take 180 mg by mouth daily.      . fluticasone (FLONASE) 50 MCG/ACT nasal spray Place 2 sprays into the nose daily.      Marland Kitchen levothyroxine (SYNTHROID, LEVOTHROID) 50 MCG tablet Take 50 mcg by mouth daily before breakfast.      . losartan (COZAAR) 50 MG tablet Take 50 mg by mouth daily.      . metFORMIN (GLUCOPHAGE) 500 MG tablet Take 500 mg by mouth 2 (two) times daily with a meal.      . Multiple Vitamins-Minerals (MULTIVITAMIN PO) Take by mouth.      Marland Kitchen  triamterene-hydrochlorothiazide (MAXZIDE-25) 37.5-25 MG per tablet Take 1 tablet by mouth daily.       No current facility-administered medications for this visit.     ROS:  Pertinent items are noted in HPI.  SOCIAL HISTORY:  Married.   PHYSICAL EXAMINATION:    BP 118/64  Pulse 62  Temp(Src) 98.2 F (36.8 C)  Ht 5' 4.25" (1.632 m)  Wt 129 lb 6.4 oz (58.695 kg)  BMI 22.04 kg/m2   Wt Readings from Last 3 Encounters:  10/18/13 129 lb 6.4 oz (58.695 kg)  09/08/13 139 lb 3.2 oz (63.141 kg)  01/10/13 129 lb (58.514 kg)     Ht Readings from Last 3 Encounters:  10/18/13 5' 4.25" (1.632 m)  09/08/13 5' 4.5" (1.638 m)  01/10/13 5' 4.25" (1.632 m)    General appearance: alert, cooperative and appears stated age Head: Normocephalic, without obvious abnormality, atraumatic Neck: no adenopathy, supple, symmetrical, trachea midline and thyroid not enlarged, symmetric, no tenderness/mass/nodules Lungs: clear to auscultation bilaterally Breasts: Inspection negative, No nipple retraction or  dimpling, No nipple discharge or bleeding, No axillary or supraclavicular adenopathy, Normal to palpation without dominant masses Heart: regular rate and rhythm Abdomen: soft, non-tender; no masses,  no organomegaly Extremities: extremities normal, atraumatic, no cyanosis or edema Skin: Skin color, texture, turgor normal. No rashes or lesions Lymph nodes: Cervical, supraclavicular, and axillary nodes normal. No abnormal inguinal nodes palpated Neurologic: Grossly normal  Pelvic: External genitalia:  no lesions              Urethra:  normal appearing urethra with no masses, tenderness or lesions              Bartholins and Skenes: normal                 Vagina: normal appearing vagina with normal color and discharge, no lesions              Cervix: absent              Pap and high risk HPV testing done: no.            Bimanual Exam:  Uterus:  uterus is absent.  Good Kegel.                                       Adnexa: normal adnexa in size, nontender and no masses                                      Rectovaginal: Confirms                                      Anus:  normal sphincter tone, no lesions  ASSESSMENT  Normal gynecologic exam. Menopausal symptoms. Vaginal atrophy.  Minor GSI.   PLAN  Mammogram recommended yearly.  Pap smear and high risk HPV testing not indicated.  Counseled on self breast exam, weight bearing exercise. Sign records release for bone density, tetanus vaccine, and for mammogram.  Will try Climara 0.025 mg weekly for one year.   See orders.  Vagifem vaginal treatment for one year.  See orders.  Discussed risk of thromboembolic events and breast cancer.  Kegel's reviewed.  Return annually or prn  An After Visit Summary was printed and given to the patient.

## 2013-10-20 ENCOUNTER — Ambulatory Visit: Payer: Managed Care, Other (non HMO) | Admitting: Obstetrics and Gynecology

## 2014-01-23 DIAGNOSIS — E039 Hypothyroidism, unspecified: Secondary | ICD-10-CM | POA: Insufficient documentation

## 2014-01-25 DIAGNOSIS — E278 Other specified disorders of adrenal gland: Secondary | ICD-10-CM | POA: Insufficient documentation

## 2014-04-02 ENCOUNTER — Encounter: Payer: Self-pay | Admitting: Obstetrics and Gynecology

## 2014-04-23 ENCOUNTER — Ambulatory Visit: Payer: Self-pay | Admitting: Family Medicine

## 2014-10-24 ENCOUNTER — Ambulatory Visit: Payer: Managed Care, Other (non HMO) | Admitting: Obstetrics and Gynecology

## 2014-10-25 ENCOUNTER — Ambulatory Visit: Payer: Managed Care, Other (non HMO) | Admitting: Obstetrics and Gynecology

## 2014-12-05 ENCOUNTER — Encounter: Payer: Self-pay | Admitting: Obstetrics and Gynecology

## 2014-12-05 ENCOUNTER — Ambulatory Visit (INDEPENDENT_AMBULATORY_CARE_PROVIDER_SITE_OTHER): Payer: Managed Care, Other (non HMO) | Admitting: Obstetrics and Gynecology

## 2014-12-05 VITALS — BP 118/70 | HR 66 | Resp 18 | Ht 64.5 in | Wt 132.4 lb

## 2014-12-05 DIAGNOSIS — Z0000 Encounter for general adult medical examination without abnormal findings: Secondary | ICD-10-CM | POA: Diagnosis not present

## 2014-12-05 DIAGNOSIS — Z01419 Encounter for gynecological examination (general) (routine) without abnormal findings: Secondary | ICD-10-CM | POA: Diagnosis not present

## 2014-12-05 LAB — POCT URINALYSIS DIPSTICK
BILIRUBIN UA: NEGATIVE
GLUCOSE UA: NEGATIVE
KETONES UA: NEGATIVE
LEUKOCYTES UA: NEGATIVE
NITRITE UA: NEGATIVE
PH UA: 5
Protein, UA: NEGATIVE
RBC UA: NEGATIVE
Urobilinogen, UA: NEGATIVE

## 2014-12-05 MED ORDER — ESTRADIOL 0.0375 MG/24HR TD PTTW: 1.0000 | MEDICATED_PATCH | TRANSDERMAL | Status: DC

## 2014-12-05 NOTE — Patient Instructions (Signed)

## 2014-12-05 NOTE — Progress Notes (Signed)
Patient ID: Carmen [name], female   DOB: [birth year], 54 y.o.   MRN: [id number] 54 y.o. G66P1001 Married Caucasian female here for annual exam.    Using Cliimara patch.  Having hot flashes at the day and during the night.  Using 1/2 patch of the Climara 0.025 mg due to adhesive problems using the entire patch. Not having reflux symptoms and is happy about that! Had terrible reflux from Minivelle 0.1 mg. Stopped Vagifem.  She did not feel she needed this.  Normal labs in October 2016. Normal cholesterol and glucose.  Some urge to void.  Rare leakage of urine.  No pad use.   PCP:  [place] Pink, MD   No LMP recorded. Patient has had a hysterectomy.          Sexually active: Yes.  female  The current method of family planning is post menopausal status.    Exercising: Yes.    walking. Smoker:  no  Health Maintenance: Pap:  10/2012 wnl History of abnormal Pap:  no MMG:  04-23-14 normal per patient:[facility]. Colonoscopy:  05/2011 normal BMD:   04/2013  Result  Normal with PCP TDaP:  2013 with PCP Screening Labs:  Hb today: PCP, Urine today: Neg   reports that she has never smoked. She has never used smokeless tobacco. She reports that she does not drink alcohol or use illicit drugs.  Past Medical History  Diagnosis Date  . Uterine prolapse   . Cystocele   . Urinary incontinence   . Diabetes   . Hyperlipidemia   . Blood sugar increased 2008  . Hypothyroid     Hashimoto's  thyroid  . Hypertension   . Fibroid   . Uterine prolapse   . Reflux esophagitis     Past Surgical History  Procedure Laterality Date  . Vaginal hysterectomy  11/08/07    Total; Cystocele repair; valut suspension. adenomycosis fibroid  . Bilateral salpingectomy      Current Outpatient Prescriptions  Medication Sig Dispense Refill  . BAYER CONTOUR NEXT TEST test strip TEST BLOOD SUGAR QD UTD  3  . Calcium Carbonate (CALTRATE 600 PO) Take 600 mg by mouth daily.    Marland Kitchen estradiol (CLIMARA) 0.025 mg/24hr patch  Place 1 patch (0.025 mg total) onto the skin once a week. 4 patch 12  . Estradiol (VAGIFEM) 10 MCG TABS vaginal tablet Place 1 tablet (10 mcg total) vaginally 2 (two) times a week. Use every night before bed for two weeks when you first begin this medicine, then after the first two weeks, begin using it twice a week. 18 tablet 11  . fexofenadine (ALLEGRA) 180 MG tablet Take 180 mg by mouth daily.    . fluticasone (FLONASE) 50 MCG/ACT nasal spray Place 2 sprays into the nose daily.    Marland Kitchen levothyroxine (SYNTHROID, LEVOTHROID) 50 MCG tablet Take 50 mcg by mouth daily before breakfast.    . losartan (COZAAR) 50 MG tablet Take 50 mg by mouth daily.    . metFORMIN (GLUCOPHAGE) 500 MG tablet Take 500 mg by mouth 2 (two) times daily with a meal.    . Multiple Vitamin (MULTI-VITAMINS) TABS Take 1 tablet by mouth daily.    . Multiple Vitamins-Minerals (MULTIVITAMIN PO) Take by mouth.    . Omega-3 1000 MG CAPS Take 1 tablet by mouth daily.    . Red Yeast Rice Extract 600 MG CAPS Take 1 tablet by mouth daily.    Marland Kitchen triamterene-hydrochlorothiazide (MAXZIDE-25) 37.5-25 MG per tablet Take 1 tablet by  mouth daily.     No current facility-administered medications for this visit.    Family History  Problem Relation Age of Onset  . Thyroid disease Mother   . Diabetes Father   . Hypertension Father   . Stroke Father   . Colon cancer Other     ROS:  Pertinent items are noted in HPI.  Otherwise, a comprehensive ROS was negative.  Exam:   BP 118/70 mmHg  Pulse 66  Resp 18  Ht 5' 4.5" (1.638 m)  Wt 132 lb 6.4 oz (60.056 kg)  BMI 22.38 kg/m2    General appearance: alert, cooperative and appears stated age Head: Normocephalic, without obvious abnormality, atraumatic Neck: no adenopathy, supple, symmetrical, trachea midline and thyroid normal to inspection and palpation Lungs: clear to auscultation bilaterally Breasts: normal appearance, no masses or tenderness, Inspection negative, No nipple retraction or  dimpling, No nipple discharge or bleeding, No axillary or supraclavicular adenopathy Heart: regular rate and rhythm Abdomen: soft, non-tender; bowel sounds normal; no masses,  no organomegaly Extremities: extremities normal, atraumatic, no cyanosis or edema Skin: Skin color, texture, turgor normal. No rashes or lesions Lymph nodes: Cervical, supraclavicular, and axillary nodes normal. No abnormal inguinal nodes palpated Neurologic: Grossly normal  Pelvic: External genitalia:  no lesions              Urethra:  normal appearing urethra with no masses, tenderness or lesions              Bartholins and Skenes: normal                 Vagina: normal appearing vagina with normal color and discharge, no lesions              Cervix: absent              Pap taken: No. Bimanual Exam:  Uterus:  uterus absent              Adnexa: normal adnexa and no mass, fullness, tenderness              Rectovaginal: Yes.  .  Confirms.              Anus:  normal sphincter tone, no lesions  Chaperone was present for exam.  Assessment:   Well woman visit with normal exam. Status post TVH.  ERT patient.  Menopausal symptoms on Climara 0.025 mg 1/2 patch weekly.   Plan: Yearly mammogram recommended after age 27.  Recommended self breast exam.  Pap and HR HPV as above. Discussed Calcium, Vitamin D, regular exercise program including cardiovascular and weight bearing exercise. Labs performed.  No..   See orders. Refills given on medications.  Yes.  .  See orders.  Will try Minivelle 0.0375 mg twice weekly. Discussed risks of DVT, PE, MI, stroke, and breast cancer.  Follow up annually and prn.   Additional counseling given regarding ERT treatment choices.  After visit summary provided.

## 2015-05-10 ENCOUNTER — Other Ambulatory Visit: Payer: Self-pay | Admitting: Family Medicine

## 2015-05-10 DIAGNOSIS — Z1231 Encounter for screening mammogram for malignant neoplasm of breast: Secondary | ICD-10-CM

## 2015-05-28 ENCOUNTER — Ambulatory Visit
Admission: RE | Admit: 2015-05-28 | Discharge: 2015-05-28 | Disposition: A | Discharge status: Home / Self Care | Payer: Managed Care, Other (non HMO) | Source: Ambulatory Visit | Attending: Family Medicine | Admitting: Family Medicine

## 2015-05-28 DIAGNOSIS — Z1231 Encounter for screening mammogram for malignant neoplasm of breast: Secondary | ICD-10-CM | POA: Insufficient documentation

## 2015-12-13 ENCOUNTER — Ambulatory Visit: Payer: Managed Care, Other (non HMO) | Admitting: Obstetrics and Gynecology

## 2015-12-18 ENCOUNTER — Ambulatory Visit (INDEPENDENT_AMBULATORY_CARE_PROVIDER_SITE_OTHER): Payer: Managed Care, Other (non HMO) | Admitting: Obstetrics and Gynecology

## 2015-12-18 ENCOUNTER — Encounter: Payer: Self-pay | Admitting: Obstetrics and Gynecology

## 2015-12-18 VITALS — BP 124/64 | HR 76 | Resp 16 | Ht 64.0 in | Wt 133.8 lb

## 2015-12-18 DIAGNOSIS — Z0000 Encounter for general adult medical examination without abnormal findings: Secondary | ICD-10-CM

## 2015-12-18 DIAGNOSIS — Z113 Encounter for screening for infections with a predominantly sexual mode of transmission: Secondary | ICD-10-CM | POA: Diagnosis not present

## 2015-12-18 DIAGNOSIS — Z01419 Encounter for gynecological examination (general) (routine) without abnormal findings: Secondary | ICD-10-CM

## 2015-12-18 LAB — POCT URINALYSIS DIPSTICK
BILIRUBIN UA: NEGATIVE
Glucose, UA: NEGATIVE
Ketones, UA: NEGATIVE
Leukocytes, UA: NEGATIVE
NITRITE UA: NEGATIVE
PH UA: 5
Protein, UA: NEGATIVE
RBC UA: NEGATIVE
UROBILINOGEN UA: NEGATIVE

## 2015-12-18 MED ORDER — ESTRADIOL 0.0375 MG/24HR TD PTTW: 1.0000 | MEDICATED_PATCH | TRANSDERMAL | Status: DC

## 2015-12-18 NOTE — Progress Notes (Signed)
Patient ID: Carmen [name], female   DOB: 03-09-1961, 55 y.o.   MRN: [id number] 55 y.o. [id number] Married Caucasian female here for annual exam.    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   Gained a few pounds.  Not using treadmill as much.   On Minivelle 0.0375 mg twice weekly.  Happy with her current dosage of the [place].  Occasional hot flash.  Not having any reflux issues as she had in the past.   Not using Vagifem as she did not need them since Thanksgiving 2016.   DM is under control. Hgb A1C was about 5.5.  PCP: Maryland Pink, MD    No LMP recorded. Patient has had a hysterectomy.           Sexually active: Yes.   female The current method of family planning is status post hysterectomy.    Exercising: Yes.    walking Smoker:  no  Health Maintenance: Pap:  10/2012 normal History of abnormal Pap:  no MMG:  05-28-15 Density B/Neg/BiRads1:[facility] Colonoscopy:  05/2011 normal BMD:   04/2013  Result  Normal with PCP TDaP:  2013 Gardasil:   N/A HIV:  Today.  Hep C:  Today.  Screening Labs:  Hb today: PCP, Urine today: Neg   reports that she has never smoked. She has never used smokeless tobacco. She reports that she does not drink alcohol or use illicit drugs.  Past Medical History  Diagnosis Date  . Uterine prolapse   . Cystocele   . Urinary incontinence   . Diabetes ([place])   . Hyperlipidemia   . Blood sugar increased 2008  . Hypothyroid     Hashimoto's  thyroid  . Hypertension   . Fibroid   . Uterine prolapse   . Reflux esophagitis     Past Surgical History  Procedure Laterality Date  . Vaginal hysterectomy  11/08/07    Total; Cystocele repair; valut suspension. adenomycosis fibroid  . Bilateral salpingectomy      Current Outpatient Prescriptions  Medication Sig Dispense Refill  . BAYER CONTOUR NEXT TEST test strip TEST BLOOD SUGAR QD UTD  3  . Calcium Carbonate (CALTRATE 600 PO) Take 600 mg by mouth daily.    Marland Kitchen estradiol (MINIVELLE) 0.0375 MG/24HR Place 1 patch  onto the skin 2 (two) times a week. Apply anywhere on lower abdomen. 8 patch 11  . fexofenadine (ALLEGRA) 180 MG tablet Take 180 mg by mouth daily.    Marland Kitchen levothyroxine (SYNTHROID, LEVOTHROID) 50 MCG tablet Take 50 mcg by mouth daily before breakfast.    . losartan (COZAAR) 50 MG tablet Take 50 mg by mouth daily.    . metFORMIN (GLUCOPHAGE) 500 MG tablet Take 500 mg by mouth 2 (two) times daily with a meal.    . Multiple Vitamin (MULTI-VITAMINS) TABS Take 1 tablet by mouth daily.    . Multiple Vitamins-Minerals (MULTIVITAMIN PO) Take by mouth.    . Omega-3 1000 MG CAPS Take 1 tablet by mouth daily.    . Red Yeast Rice Extract 600 MG CAPS Take 1 tablet by mouth daily.    Marland Kitchen triamterene-hydrochlorothiazide (MAXZIDE-25) 37.5-25 MG per tablet Take 1 tablet by mouth daily.     No current facility-administered medications for this visit.    Family History  Problem Relation Age of Onset  . Thyroid disease Mother   . Osteoporosis Mother   . Diabetes Father   . Hypertension Father   . Stroke Father   . Colon cancer Other  ROS:  Pertinent items are noted in HPI.  Otherwise, a comprehensive ROS was negative.  Exam:   BP 124/64 mmHg  Pulse 76  Resp 16  Ht 5\' 4"  (1.626 m)  Wt 133 lb 12.8 oz (60.691 kg)  BMI 22.96 kg/m2    General appearance: alert, cooperative and appears stated age Head: Normocephalic, without obvious abnormality, atraumatic Neck: no adenopathy, supple, symmetrical, trachea midline and thyroid normal to inspection and palpation Lungs: clear to auscultation bilaterally Breasts: normal appearance, no masses or tenderness, Inspection negative, No nipple retraction or dimpling, No nipple discharge or bleeding, No axillary or supraclavicular adenopathy Heart: regular rate and rhythm Abdomen:  soft, non-tender; no masses, no organomegaly Extremities: extremities normal, atraumatic, no cyanosis or edema Skin: Skin color, texture, turgor normal. No rashes or lesions Lymph  nodes: Cervical, supraclavicular, and axillary nodes normal. No abnormal inguinal nodes palpated Neurologic: Grossly normal  Pelvic: External genitalia:  no lesions              Urethra:  normal appearing urethra with no masses, tenderness or lesions              Bartholins and Skenes: normal                 Vagina: normal appearing vagina with normal color and discharge, no lesions              Cervix: absent              Pap taken: No. Bimanual Exam:  Uterus:  uterus absent              Adnexa: normal adnexa and no mass, fullness, tenderness              Rectal exam: Yes.  .  Confirms.              Anus:  normal sphincter tone, no lesions  Chaperone was present for exam.  Assessment:   Well woman visit with normal exam. Status post TVH and vault suspension.  ERT patient. HTN, DM, hyperlipidemia.  Screening for infectious disease.   Plan: Yearly mammogram recommended after age 15.  Recommended self breast exam.  Pap and HR HPV as above. Discussed Calcium, Vitamin D, regular exercise program including cardiovascular and weight bearing exercise. Labs performed.  Yes.  .   See orders.  HIV and Hep C testing based on current guidelines for testing once in a lifetime.  Prescription medication(s) given.  Yes.  .  See orders.  Minivelle 0.0375 mg twice weekly for one year.  Discussed risks of DVT, PE, MI, stroke, and breast cancer.  Patient may try to wean off in the fall this year.  Follow up annually and prn.      After visit summary provided.

## 2015-12-18 NOTE — Patient Instructions (Signed)
Health Maintenance, Female Adopting a healthy lifestyle and getting preventive care can go a long way to promote health and wellness. Talk with your health care provider about what schedule of regular examinations is right for you. This is a good chance for you to check in with your provider about disease prevention and staying healthy. In between checkups, there are plenty of things you can do on your own. Experts have done a lot of research about which lifestyle changes and preventive measures are most likely to keep you healthy. Ask your health care provider for more information. WEIGHT AND DIET  Eat a healthy diet  Be sure to include plenty of vegetables, fruits, low-fat dairy products, and lean protein.  Do not eat a lot of foods high in solid fats, added sugars, or salt.  Get regular exercise. This is one of the most important things you can do for your health.  Most adults should exercise for at least 150 minutes each week. The exercise should increase your heart rate and make you sweat (moderate-intensity exercise).  Most adults should also do strengthening exercises at least twice a week. This is in addition to the moderate-intensity exercise.  Maintain a healthy weight  Body mass index (BMI) is a measurement that can be used to identify possible weight problems. It estimates body fat based on height and weight. Your health care provider can help determine your BMI and help you achieve or maintain a healthy weight.  For females 20 years of age and older:   A BMI below 18.5 is considered underweight.  A BMI of 18.5 to 24.9 is normal.  A BMI of 25 to 29.9 is considered overweight.  A BMI of 30 and above is considered obese.  Watch levels of cholesterol and blood lipids  You should start having your blood tested for lipids and cholesterol at 55 years of age, then have this test every 5 years.  You may need to have your cholesterol levels checked more often if:  Your lipid  or cholesterol levels are high.  You are older than 55 years of age.  You are at high risk for heart disease.  CANCER SCREENING   Lung Cancer  Lung cancer screening is recommended for adults 55-80 years old who are at high risk for lung cancer because of a history of smoking.  A yearly low-dose CT scan of the lungs is recommended for people who:  Currently smoke.  Have quit within the past 15 years.  Have at least a 30-pack-year history of smoking. A pack year is smoking an average of one pack of cigarettes a day for 1 year.  Yearly screening should continue until it has been 15 years since you quit.  Yearly screening should stop if you develop a health problem that would prevent you from having lung cancer treatment.  Breast Cancer  Practice breast self-awareness. This means understanding how your breasts normally appear and feel.  It also means doing regular breast self-exams. Let your health care provider know about any changes, no matter how small.  If you are in your 20s or 30s, you should have a clinical breast exam (CBE) by a health care provider every 1-3 years as part of a regular health exam.  If you are 40 or older, have a CBE every year. Also consider having a breast X-ray (mammogram) every year.  If you have a family history of breast cancer, talk to your health care provider about genetic screening.  If you   are at high risk for breast cancer, talk to your health care provider about having an MRI and a mammogram every year.  Breast cancer gene (BRCA) assessment is recommended for women who have family members with BRCA-related cancers. BRCA-related cancers include:  Breast.  Ovarian.  Tubal.  Peritoneal cancers.  Results of the assessment will determine the need for genetic counseling and BRCA1 and BRCA2 testing. Cervical Cancer Your health care provider may recommend that you be screened regularly for cancer of the pelvic organs (ovaries, uterus, and  vagina). This screening involves a pelvic examination, including checking for microscopic changes to the surface of your cervix (Pap test). You may be encouraged to have this screening done every 3 years, beginning at age 21.  For women ages 30-65, health care providers may recommend pelvic exams and Pap testing every 3 years, or they may recommend the Pap and pelvic exam, combined with testing for human papilloma virus (HPV), every 5 years. Some types of HPV increase your risk of cervical cancer. Testing for HPV may also be done on women of any age with unclear Pap test results.  Other health care providers may not recommend any screening for nonpregnant women who are considered low risk for pelvic cancer and who do not have symptoms. Ask your health care provider if a screening pelvic exam is right for you.  If you have had past treatment for cervical cancer or a condition that could lead to cancer, you need Pap tests and screening for cancer for at least 20 years after your treatment. If Pap tests have been discontinued, your risk factors (such as having a new sexual partner) need to be reassessed to determine if screening should resume. Some women have medical problems that increase the chance of getting cervical cancer. In these cases, your health care provider may recommend more frequent screening and Pap tests. Colorectal Cancer  This type of cancer can be detected and often prevented.  Routine colorectal cancer screening usually begins at 55 years of age and continues through 55 years of age.  Your health care provider may recommend screening at an earlier age if you have risk factors for colon cancer.  Your health care provider may also recommend using home test kits to check for hidden blood in the stool.  A small camera at the end of a tube can be used to examine your colon directly (sigmoidoscopy or colonoscopy). This is done to check for the earliest forms of colorectal  cancer.  Routine screening usually begins at age 50.  Direct examination of the colon should be repeated every 5-10 years through 55 years of age. However, you may need to be screened more often if early forms of precancerous polyps or small growths are found. Skin Cancer  Check your skin from head to toe regularly.  Tell your health care provider about any new moles or changes in moles, especially if there is a change in a mole's shape or color.  Also tell your health care provider if you have a mole that is larger than the size of a pencil eraser.  Always use sunscreen. Apply sunscreen liberally and repeatedly throughout the day.  Protect yourself by wearing long sleeves, pants, a wide-brimmed hat, and sunglasses whenever you are outside. HEART DISEASE, DIABETES, AND HIGH BLOOD PRESSURE   High blood pressure causes heart disease and increases the risk of stroke. High blood pressure is more likely to develop in:  People who have blood pressure in the high end   of the normal range (130-139/85-89 mm Hg).  People who are overweight or obese.  People who are African American.  If you are 38-23 years of age, have your blood pressure checked every 3-5 years. If you are 61 years of age or older, have your blood pressure checked every year. You should have your blood pressure measured twice--once when you are at a hospital or clinic, and once when you are not at a hospital or clinic. Record the average of the two measurements. To check your blood pressure when you are not at a hospital or clinic, you can use:  An automated blood pressure machine at a pharmacy.  A home blood pressure monitor.  If you are between 45 years and 39 years old, ask your health care provider if you should take aspirin to prevent strokes.  Have regular diabetes screenings. This involves taking a blood sample to check your fasting blood sugar level.  If you are at a normal weight and have a low risk for diabetes,  have this test once every three years after 55 years of age.  If you are overweight and have a high risk for diabetes, consider being tested at a younger age or more often. PREVENTING INFECTION  Hepatitis B  If you have a higher risk for hepatitis B, you should be screened for this virus. You are considered at high risk for hepatitis B if:  You were born in a country where hepatitis B is common. Ask your health care provider which countries are considered high risk.  Your parents were born in a high-risk country, and you have not been immunized against hepatitis B (hepatitis B vaccine).  You have HIV or AIDS.  You use needles to inject street drugs.  You live with someone who has hepatitis B.  You have had sex with someone who has hepatitis B.  You get hemodialysis treatment.  You take certain medicines for conditions, including cancer, organ transplantation, and autoimmune conditions. Hepatitis C  Blood testing is recommended for:  Everyone born from [birth year] through 1965.  Anyone with known risk factors for hepatitis C. Sexually transmitted infections (STIs)  You should be screened for sexually transmitted infections (STIs) including gonorrhea and chlamydia if:  You are sexually active and are younger than 55 years of age.  You are older than 55 years of age and your health care provider tells you that you are at risk for this type of infection.  Your sexual activity has changed since you were last screened and you are at an increased risk for chlamydia or gonorrhea. Ask your health care provider if you are at risk.  If you do not have HIV, but are at risk, it may be recommended that you take a prescription medicine daily to prevent HIV infection. This is called pre-exposure prophylaxis (PrEP). You are considered at risk if:  You are sexually active and do not regularly use condoms or know the HIV status of your partner(s).  You take drugs by injection.  You are sexually  active with a partner who has HIV. Talk with your health care provider about whether you are at high risk of being infected with HIV. If you choose to begin PrEP, you should first be tested for HIV. You should then be tested every 3 months for as long as you are taking PrEP.  PREGNANCY   If you are premenopausal and you may become pregnant, ask your health care provider about preconception counseling.  If you may  become pregnant, take 400 to 800 micrograms (mcg) of folic acid every day.  If you want to prevent pregnancy, talk to your health care provider about birth control (contraception). OSTEOPOROSIS AND MENOPAUSE   Osteoporosis is a disease in which the bones lose minerals and strength with aging. This can result in serious bone fractures. Your risk for osteoporosis can be identified using a bone density scan.  If you are 61 years of age or older, or if you are at risk for osteoporosis and fractures, ask your health care provider if you should be screened.  Ask your health care provider whether you should take a calcium or vitamin D supplement to lower your risk for osteoporosis.  Menopause may have certain physical symptoms and risks.  Hormone replacement therapy may reduce some of these symptoms and risks. Talk to your health care provider about whether hormone replacement therapy is right for you.  HOME CARE INSTRUCTIONS   Schedule regular health, dental, and eye exams.  Stay current with your immunizations.   Do not use any tobacco products including cigarettes, chewing tobacco, or electronic cigarettes.  If you are pregnant, do not drink alcohol.  If you are breastfeeding, limit how much and how often you drink alcohol.  Limit alcohol intake to no more than 1 drink per day for nonpregnant women. One drink equals 12 ounces of beer, 5 ounces of wine, or 1 ounces of hard liquor.  Do not use street drugs.  Do not share needles.  Ask your health care provider for help if  you need support or information about quitting drugs.  Tell your health care provider if you often feel depressed.  Tell your health care provider if you have ever been abused or do not feel safe at home.   This information is not intended to replace advice given to you by your health care provider. Make sure you discuss any questions you have with your health care provider.   Document Released: 12/01/2010 Document Revised: 06/08/2014 Document Reviewed: 04/19/2013 Elsevier Interactive Patient Education [employer].

## 2015-12-19 LAB — HIV ANTIBODY (ROUTINE TESTING W REFLEX): HIV 1&2 Ab, 4th Generation: NONREACTIVE

## 2015-12-19 LAB — HEPATITIS C ANTIBODY: HCV Ab: NEGATIVE

## 2016-04-16 ENCOUNTER — Other Ambulatory Visit: Payer: Self-pay | Admitting: Family Medicine

## 2016-04-16 DIAGNOSIS — Z1231 Encounter for screening mammogram for malignant neoplasm of breast: Secondary | ICD-10-CM

## 2016-05-08 ENCOUNTER — Ambulatory Visit (INDEPENDENT_AMBULATORY_CARE_PROVIDER_SITE_OTHER): Payer: Managed Care, Other (non HMO) | Admitting: Vascular Surgery

## 2016-05-08 ENCOUNTER — Encounter (INDEPENDENT_AMBULATORY_CARE_PROVIDER_SITE_OTHER): Payer: Self-pay | Admitting: Vascular Surgery

## 2016-05-08 VITALS — BP 127/81 | HR 88 | Resp 16 | Ht 64.0 in | Wt 135.0 lb

## 2016-05-08 DIAGNOSIS — I10 Essential (primary) hypertension: Secondary | ICD-10-CM

## 2016-05-08 DIAGNOSIS — E119 Type 2 diabetes mellitus without complications: Secondary | ICD-10-CM | POA: Insufficient documentation

## 2016-05-08 DIAGNOSIS — I152 Hypertension secondary to endocrine disorders: Secondary | ICD-10-CM | POA: Insufficient documentation

## 2016-05-08 DIAGNOSIS — E1159 Type 2 diabetes mellitus with other circulatory complications: Secondary | ICD-10-CM | POA: Insufficient documentation

## 2016-05-08 DIAGNOSIS — I83891 Varicose veins of right lower extremities with other complications: Secondary | ICD-10-CM | POA: Diagnosis not present

## 2016-05-08 DIAGNOSIS — E118 Type 2 diabetes mellitus with unspecified complications: Secondary | ICD-10-CM | POA: Diagnosis not present

## 2016-05-08 NOTE — Progress Notes (Signed)
Patient ID: Carmen [name], female   DOB: 1961/02/25, 55 y.o.   MRN: [id number]  Chief Complaint  Patient presents with  . New Evaluation    Large vein on right leg    HPI Carmen [name] is a 55 y.o. female.  I am asked to see the patient by Dr. Kary Kos for evaluation of varicose veins.  The patient presents with complaints of symptomatic varicosities of the right leg. The patient reports a long standing history of varicosities and they have become bothersome and enlarged over time. There was no clear inciting event or causative factor that started the symptoms. This started a year or two ago. The right leg is more severly affected. The patient elevates the legs for relief. The symptoms are generally most severe in the evening, particularly when they have been on their feet for long periods of time. Elevation has been used to try to improve the symptoms with some success. The patient does not have much swelling. The patient has no previous history of deep venous thrombosis or superficial thrombophlebitis to their knowledge.     Past Medical History:  Diagnosis Date  . Blood sugar increased 2008  . Cystocele   . Diabetes ([place])   . Fibroid   . Hyperlipidemia   . Hypertension   . Hypothyroid    Hashimoto's  thyroid  . Reflux esophagitis   . Urinary incontinence   . Uterine prolapse   . Uterine prolapse     Past Surgical History:  Procedure Laterality Date  . BILATERAL SALPINGECTOMY    . VAGINAL HYSTERECTOMY  11/08/07   Total; Cystocele repair; valut suspension. adenomycosis fibroid    Family History  Problem Relation Age of Onset  . Thyroid disease Mother   . Osteoporosis Mother   . Diabetes Father   . Hypertension Father   . Stroke Father   . Colon cancer Other      Social History Social History  Substance Use Topics  . Smoking status: Never Smoker  . Smokeless tobacco: Never Used  . Alcohol use No  No IVDU  Allergies  Allergen Reactions  . Atenolol Swelling  .  Latex Itching    Current Outpatient Prescriptions  Medication Sig Dispense Refill  . BAYER CONTOUR NEXT TEST test strip TEST BLOOD SUGAR QD UTD  3  . Calcium Carbonate (CALTRATE 600 PO) Take 600 mg by mouth daily.    . Cyanocobalamin (RA VITAMIN B-12 TR) 1000 MCG TBCR Take by mouth.    . estradiol (MINIVELLE) 0.0375 MG/24HR Place 1 patch onto the skin 2 (two) times a week. Apply anywhere on lower abdomen. 8 patch 11  . fexofenadine (ALLEGRA) 180 MG tablet Take 180 mg by mouth daily.    Marland Kitchen levothyroxine (SYNTHROID, LEVOTHROID) 50 MCG tablet Take 50 mcg by mouth daily before breakfast.    . losartan (COZAAR) 50 MG tablet Take 50 mg by mouth daily.    . metFORMIN (GLUCOPHAGE) 500 MG tablet Take 500 mg by mouth 2 (two) times daily with a meal.    . Multiple Vitamin (MULTI-VITAMINS) TABS Take 1 tablet by mouth daily.    . Multiple Vitamins-Minerals (MULTIVITAMIN PO) Take by mouth.    . Omega-3 1000 MG CAPS Take 1 tablet by mouth daily.    . Red Yeast Rice Extract 600 MG CAPS Take 1 tablet by mouth daily.    Marland Kitchen triamterene-hydrochlorothiazide (MAXZIDE-25) 37.5-25 MG per tablet Take 1 tablet by mouth daily.     No current  facility-administered medications for this visit.       REVIEW OF SYSTEMS (Negative unless checked)  Constitutional: [] Weight loss  [] Fever  [] Chills Cardiac: [] Chest pain   [] Chest pressure   [] Palpitations   [] Shortness of breath when laying flat   [] Shortness of breath at rest   [] Shortness of breath with exertion. Vascular:  [] Pain in legs with walking   [] Pain in legs at rest   [] Pain in legs when laying flat   [] Claudication   [] Pain in feet when walking  [] Pain in feet at rest  [] Pain in feet when laying flat   [] History of DVT   [] Phlebitis   [] Swelling in legs   [x] Varicose veins   [] Non-healing ulcers Pulmonary:   [] Uses home oxygen   [] Productive cough   [] Hemoptysis   [] Wheeze  [] COPD   [] Asthma Neurologic:  [] Dizziness  [] Blackouts   [] Seizures   [] History of  stroke   [] History of TIA  [] Aphasia   [] Temporary blindness   [] Dysphagia   [] Weakness or numbness in arms   [] Weakness or numbness in legs Musculoskeletal:  [] Arthritis   [] Joint swelling   [] Joint pain   [] Low back pain Hematologic:  [] Easy bruising  [] Easy bleeding   [] Hypercoagulable state   [] Anemic  [] Hepatitis Gastrointestinal:  [] Blood in stool   [] Vomiting blood  [] Gastroesophageal reflux/heartburn   [] Abdominal pain Genitourinary:  [] Chronic kidney disease   [] Difficult urination  [] Frequent urination  [] Burning with urination   [] Hematuria Skin:  [] Rashes   [] Ulcers   [] Wounds Psychological:  [] History of anxiety   []  History of major depression.    Physical Exam BP 127/81 (BP Location: Right Arm)   Pulse 88   Resp 16   Ht 5\' 4"  (1.626 m)   Wt 135 lb (61.2 kg)   BMI 23.17 kg/m  Gen:  WD/WN, NAD. Appears younger than stated age Head: [place]/AT, No temporalis wasting.  Ear/Nose/Throat: Hearing grossly intact, dentition good Eyes: Sclera non-icteric. Conjunctiva clear Neck: Supple, no nuchal rigidity. Trachea midline Pulmonary:  Good air movement, no use of accessory muscles, respirations not labored.  Cardiac: RRR, No JVD Vascular: Varicosities prominent and measuring up to 3-4 mm in the right lower extremity        Varicosities scant in the left lower extremity Vessel Right Left  Radial Palpable Palpable  Ulnar Palpable Palpable  Brachial Palpable Palpable  Carotid Palpable, without bruit Palpable, without bruit  Aorta Not palpable N/A  Femoral Palpable Palpable  Popliteal Palpable Palpable  PT Palpable Palpable  DP Palpable Palpable   Gastrointestinal: soft, non-tender/non-distended. No guarding/reflex. No masses, surgical incisions, or scars. Musculoskeletal: M/S 5/5 throughout.  No edema Neurologic: Sensation grossly intact in extremities.  Symmetrical.  Speech is fluent.  Psychiatric: Judgment intact, Mood & affect appropriate for pt's clinical  situation. Dermatologic: No rashes or ulcers noted.  No cellulitis or open wounds. Lymph : No Cervical, Axillary, or Inguinal lymphadenopathy.   Radiology No results found.  Labs No results found for this or any previous visit (from the past 2160 hour(s)).  Assessment/Plan:  Essential hypertension blood pressure control important in reducing the progression of atherosclerotic disease. On appropriate oral medications.   Diabetes ([place]) blood glucose control important in reducing the progression of atherosclerotic disease. Also, involved in wound healing. On appropriate medications.   Symptomatic varicose veins, right See below    The patient has symptoms consistent with chronic venous insufficiency. We discussed the natural history and treatment options for venous disease. I recommended the  regular use of 20 - 30 mm Hg compression stockings, and prescribed these today. I recommended leg elevation and anti-inflammatories as needed for pain. I have also recommended a complete venous duplex to assess the venous system for reflux or thrombotic issues. This can be done at the patient's convenience. I will see the patient back in 3 months to assess the response to conservative management, and determine further treatment options.     Leotis Pain 05/08/2016, 2:20 PM   This note was created with Dragon medical transcription system.  Any errors from dictation are unintentional.

## 2016-05-08 NOTE — Patient Instructions (Signed)
Venous Stasis or Chronic Venous Insufficiency Chronic venous insufficiency, also called venous stasis, is a condition that affects the veins in the legs. The condition prevents blood from being pumped through these veins effectively. Blood may no longer be pumped effectively from the legs back to the heart. This condition can range from mild to severe. With proper treatment, you should be able to continue with an active life. CAUSES  Chronic venous insufficiency occurs when the vein walls become stretched, weakened, or damaged or when valves within the vein are damaged. Some common causes of this include:  High blood pressure inside the veins (venous hypertension).  Increased blood pressure in the leg veins from long periods of sitting or standing.  A blood clot that blocks blood flow in a vein (deep vein thrombosis).  Inflammation of a superficial vein (phlebitis) that causes a blood clot to form. RISK FACTORS Various things can make you more likely to develop chronic venous insufficiency, including:  Family history of this condition.  Obesity.  Pregnancy.  Sedentary lifestyle.  Smoking.  Jobs requiring long periods of standing or sitting in one place.  Being a certain age. Women in their 40s and 50s and men in their 70s are more likely to develop this condition. SIGNS AND SYMPTOMS  Symptoms may include:   Varicose veins.  Skin breakdown or ulcers.  Reddened or discolored skin on the leg.  Brown, smooth, tight, and painful skin just above the ankle, usually on the inside surface (lipodermatosclerosis).  Swelling. DIAGNOSIS  To diagnose this condition, your health care provider will take a medical history and do a physical exam. The following tests may be ordered to confirm the diagnosis:  Duplex ultrasound-A procedure that produces a picture of a blood vessel and nearby organs and also provides information on blood flow through the blood vessel.  Plethysmography-A  procedure that tests blood flow.  A venogram, or venography-A procedure used to look at the veins using X-ray and dye. TREATMENT The goals of treatment are to help you return to an active life and to minimize pain or disability. Treatment will depend on the severity of the condition. Medical procedures may be needed for severe cases. Treatment options may include:   Use of compression stockings. These can help with symptoms and lower the chances of the problem getting worse, but they do not cure the problem.  Sclerotherapy-A procedure involving an injection of a material that "dissolves" the damaged veins. Other veins in the network of blood vessels take over the function of the damaged veins.  Surgery to remove the vein or cut off blood flow through the vein (vein stripping or laser ablation surgery).  Surgery to repair a valve. HOME CARE INSTRUCTIONS   Wear compression stockings as directed by your health care provider.  Only take over-the-counter or prescription medicines for pain, discomfort, or fever as directed by your health care provider.  Follow up with your health care provider as directed. SEEK MEDICAL CARE IF:   You have redness, swelling, or increasing pain in the affected area.  You see a red streak or line that extends up or down from the affected area.  You have a breakdown or loss of skin in the affected area, even if the breakdown is small.  You have an injury to the affected area. SEEK IMMEDIATE MEDICAL CARE IF:   You have an injury and open wound in the affected area.  Your pain is severe and does not improve with medicine.  You have   sudden numbness or weakness in the foot or ankle below the affected area, or you have trouble moving your foot or ankle.  You have a fever or persistent symptoms for more than 2-3 days.  You have a fever and your symptoms suddenly get worse. MAKE SURE YOU:   Understand these instructions.  Will watch your condition.  Will  get help right away if you are not doing well or get worse. This information is not intended to replace advice given to you by your health care provider. Make sure you discuss any questions you have with your health care provider. Document Released: 09/21/2006 Document Revised: 03/08/2013 Document Reviewed: 01/23/2013 Elsevier Interactive Patient Education  2017 [employer]  

## 2016-05-08 NOTE — Assessment & Plan Note (Signed)
blood pressure control important in reducing the progression of atherosclerotic disease. On appropriate oral medications.  

## 2016-05-08 NOTE — Assessment & Plan Note (Signed)
blood glucose control important in reducing the progression of atherosclerotic disease. Also, involved in wound healing. On appropriate medications.  

## 2016-05-08 NOTE — Assessment & Plan Note (Signed)
See below

## 2016-05-20 ENCOUNTER — Telehealth: Payer: Self-pay | Admitting: Gastroenterology

## 2016-06-03 ENCOUNTER — Ambulatory Visit: Payer: Managed Care, Other (non HMO)

## 2016-06-03 NOTE — Telephone Encounter (Signed)
Spoke with patient. Discussed with patient that Dr.Jacobs accepted her and after reviewed records and found no polyps in 2012 colon, would recommend recall in 05/2021. If she is having any issues she can call back and sch new pt ov.

## 2016-06-04 ENCOUNTER — Ambulatory Visit
Admission: RE | Admit: 2016-06-04 | Discharge: 2016-06-04 | Disposition: A | Discharge status: Home / Self Care | Payer: Managed Care, Other (non HMO) | Source: Ambulatory Visit | Attending: Family Medicine | Admitting: Family Medicine

## 2016-06-04 ENCOUNTER — Encounter: Payer: Self-pay | Admitting: Unknown Physician Specialty

## 2016-06-04 DIAGNOSIS — Z1231 Encounter for screening mammogram for malignant neoplasm of breast: Secondary | ICD-10-CM | POA: Diagnosis present

## 2016-08-11 ENCOUNTER — Encounter (INDEPENDENT_AMBULATORY_CARE_PROVIDER_SITE_OTHER): Payer: Managed Care, Other (non HMO)

## 2016-08-11 ENCOUNTER — Ambulatory Visit (INDEPENDENT_AMBULATORY_CARE_PROVIDER_SITE_OTHER): Payer: Managed Care, Other (non HMO) | Admitting: Vascular Surgery

## 2016-09-08 ENCOUNTER — Encounter (INDEPENDENT_AMBULATORY_CARE_PROVIDER_SITE_OTHER): Payer: Self-pay | Admitting: Vascular Surgery

## 2016-09-08 ENCOUNTER — Ambulatory Visit (INDEPENDENT_AMBULATORY_CARE_PROVIDER_SITE_OTHER): Payer: Managed Care, Other (non HMO)

## 2016-09-08 ENCOUNTER — Ambulatory Visit (INDEPENDENT_AMBULATORY_CARE_PROVIDER_SITE_OTHER): Payer: Managed Care, Other (non HMO) | Admitting: Vascular Surgery

## 2016-09-08 VITALS — BP 124/84 | HR 80 | Resp 16 | Ht 64.0 in | Wt 136.0 lb

## 2016-09-08 DIAGNOSIS — I10 Essential (primary) hypertension: Secondary | ICD-10-CM

## 2016-09-08 DIAGNOSIS — E118 Type 2 diabetes mellitus with unspecified complications: Secondary | ICD-10-CM | POA: Diagnosis not present

## 2016-09-08 DIAGNOSIS — I83891 Varicose veins of right lower extremities with other complications: Secondary | ICD-10-CM | POA: Diagnosis not present

## 2016-09-08 NOTE — Progress Notes (Signed)
Patient ID: Carmen [name], female   DOB: [birth year], 56 y.o.   MRN: [id number]  Chief Complaint  Patient presents with  . Re-evaluation    Ultrasound follow up    HPI Carmen [name] is a 56 y.o. female.  Patient returns in follow up of their venous disease.  They have done their best to comply with the prescribed conservative therapies of compression stockings, leg elevation, exercise, and still requires anti-inflammatories for discomfort and has symptoms that are persistent and bothersome on a daily basis, affecting their activities of daily living and normal activities.  The venous reflux study demonstrates No DVT or superficial thrombophlebitis, with reflux in a right great saphenous vein branch identified.   HPI      Past Medical History:  Diagnosis Date  . Blood sugar increased 2008  . Cystocele   . Diabetes ([place])   . Fibroid   . Hyperlipidemia   . Hypertension   . Hypothyroid    Hashimoto's  thyroid  . Reflux esophagitis   . Urinary incontinence   . Uterine prolapse   . Uterine prolapse          Past Surgical History:  Procedure Laterality Date  . BILATERAL SALPINGECTOMY    . VAGINAL HYSTERECTOMY  11/08/07   Total; Cystocele repair; valut suspension. adenomycosis fibroid         Family History  Problem Relation Age of Onset  . Thyroid disease Mother   . Osteoporosis Mother   . Diabetes Father   . Hypertension Father   . Stroke Father   . Colon cancer Other      Social History     Social History  Substance Use Topics  . Smoking status: Never Smoker  . Smokeless tobacco: Never Used  . Alcohol use No  No IVDU      Allergies  Allergen Reactions  . Atenolol Swelling  . Latex Itching          Current Outpatient Prescriptions  Medication Sig Dispense Refill  . BAYER CONTOUR NEXT TEST test strip TEST BLOOD SUGAR QD UTD  3  . Calcium Carbonate (CALTRATE 600 PO) Take 600 mg by mouth daily.    . Cyanocobalamin (RA  VITAMIN B-12 TR) 1000 MCG TBCR Take by mouth.    . estradiol (MINIVELLE) 0.0375 MG/24HR Place 1 patch onto the skin 2 (two) times a week. Apply anywhere on lower abdomen. 8 patch 11  . fexofenadine (ALLEGRA) 180 MG tablet Take 180 mg by mouth daily.    Marland Kitchen levothyroxine (SYNTHROID, LEVOTHROID) 50 MCG tablet Take 50 mcg by mouth daily before breakfast.    . losartan (COZAAR) 50 MG tablet Take 50 mg by mouth daily.    . metFORMIN (GLUCOPHAGE) 500 MG tablet Take 500 mg by mouth 2 (two) times daily with a meal.    . Multiple Vitamin (MULTI-VITAMINS) TABS Take 1 tablet by mouth daily.    . Multiple Vitamins-Minerals (MULTIVITAMIN PO) Take by mouth.    . Omega-3 1000 MG CAPS Take 1 tablet by mouth daily.    . Red Yeast Rice Extract 600 MG CAPS Take 1 tablet by mouth daily.    Marland Kitchen triamterene-hydrochlorothiazide (MAXZIDE-25) 37.5-25 MG per tablet Take 1 tablet by mouth daily.     No current facility-administered medications for this visit.       REVIEW OF SYSTEMS (Negative unless checked)  Constitutional: [] Weight loss  [] Fever  [] Chills Cardiac: [] Chest pain   [] Chest pressure   [] Palpitations   []   Shortness of breath when laying flat   [] Shortness of breath at rest   [] Shortness of breath with exertion. Vascular:  [] Pain in legs with walking   [] Pain in legs at rest   [] Pain in legs when laying flat   [] Claudication   [] Pain in feet when walking  [] Pain in feet at rest  [] Pain in feet when laying flat   [] History of DVT   [] Phlebitis   [] Swelling in legs   [x] Varicose veins   [] Non-healing ulcers Pulmonary:   [] Uses home oxygen   [] Productive cough   [] Hemoptysis   [] Wheeze  [] COPD   [] Asthma Neurologic:  [] Dizziness  [] Blackouts   [] Seizures   [] History of stroke   [] History of TIA  [] Aphasia   [] Temporary blindness   [] Dysphagia   [] Weakness or numbness in arms   [] Weakness or numbness in legs Musculoskeletal:  [] Arthritis   [] Joint swelling   [] Joint pain   [] Low back  pain Hematologic:  [] Easy bruising  [] Easy bleeding   [] Hypercoagulable state   [] Anemic  [] Hepatitis Gastrointestinal:  [] Blood in stool   [] Vomiting blood  [] Gastroesophageal reflux/heartburn   [] Abdominal pain Genitourinary:  [] Chronic kidney disease   [] Difficult urination  [] Frequent urination  [] Burning with urination   [] Hematuria Skin:  [] Rashes   [] Ulcers   [] Wounds Psychological:  [] History of anxiety   []  History of major depression.     Physical Exam BP 124/84 (BP Location: Right Arm)   Pulse 80   Resp 16   Ht 5\' 4"  (1.626 m)   Wt 136 lb (61.7 kg)   BMI 23.34 kg/m  Gen:  WD/WN, NAD Head: [place]/AT, No temporalis wasting.  Ear/Nose/Throat: Hearing grossly intact, dentition good Eyes: Sclera non-icteric. Conjunctiva clear Neck: Supple. Trachea midline Pulmonary:  Good air movement, no use of accessory muscles, respirations not labored.  Cardiac: RRR, No JVD Vascular: Varicosities diffuse and measuring up to 3-4 mm in the right lower extremity        Varicosities scattered in the left lower extremity Vessel Right Left  Radial Palpable Palpable  Ulnar Palpable Palpable  Brachial Palpable Palpable  Carotid Palpable, without bruit Palpable, without bruit  Aorta Not palpable N/A  Femoral Palpable Palpable  Popliteal Palpable Palpable  PT Palpable Palpable  DP Palpable Palpable   Gastrointestinal: soft, non-tender/non-distended. No guarding/reflex. No masses, surgical incisions, or scars. Musculoskeletal: M/S 5/5 throughout.   1 + RLE edema.  No LLE edema Neurologic: Sensation grossly intact in extremities.  Symmetrical.  Speech is fluent.  Psychiatric: Judgment intact, Mood & affect appropriate for pt's clinical situation. Dermatologic: No rashes or ulcers noted.  No cellulitis or open wounds. Lymph : No Cervical, Axillary, or Inguinal lymphadenopathy.   Radiology No results found.  Labs No results found for this or any previous visit (from the past 2160  hour(s)).  Assessment/Plan:  Essential hypertension blood pressure control important in reducing the progression of atherosclerotic disease. On appropriate oral medications.   Diabetes ([place]) blood glucose control important in reducing the progression of atherosclerotic disease. Also, involved in wound healing. On appropriate medications.  No problem-specific Assessment & Plan notes found for this encounter.   The patient has done their best to comply with conservative therapy of 20-30 mm Hg compression stockings, leg elevation, exercise, and anti-inflammatories as needed for discomfort.  Despite this, they continue to have daily and persistent symptoms from their venous disease.  A venous reflux study demonstrates reflux in a large right GSV branch.  As such, the  patient is likely to benefit from foam sclerotherapy of this large right GSV branch as it would likely not be amenable to laser ablation.  The patient is considering this intervention and would probably like to have this done, but has multiple complex coming up over the next couple of months and does not really want to proceed immediately which is reasonable. She is going to continue wearing her compression stockings and return for a follow-up visit in 3-4 months to discuss further therapies.    Leotis Pain 09/08/2016, 4:43 PM

## 2016-09-08 NOTE — Assessment & Plan Note (Signed)
See below

## 2016-12-23 ENCOUNTER — Ambulatory Visit (INDEPENDENT_AMBULATORY_CARE_PROVIDER_SITE_OTHER): Payer: Managed Care, Other (non HMO) | Admitting: Obstetrics and Gynecology

## 2016-12-23 ENCOUNTER — Encounter: Payer: Self-pay | Admitting: Obstetrics and Gynecology

## 2016-12-23 VITALS — BP 118/68 | HR 72 | Resp 16 | Ht 64.0 in | Wt 136.0 lb

## 2016-12-23 DIAGNOSIS — Z01419 Encounter for gynecological examination (general) (routine) without abnormal findings: Secondary | ICD-10-CM | POA: Diagnosis not present

## 2016-12-23 NOTE — Progress Notes (Signed)
56 y.o. G4P1001 Married Caucasian female here for annual exam.    Not happy with her weight but has a plan for this.  Off estrogen patch since about Christmas time.  Having some hot flashes.  Asking about the reasoning for stopping ERT.  Labs with PCP.   PCP:  Dr. Kary Kos   No LMP recorded. Patient has had a hysterectomy.           Sexually active: Yes.    The current method of family planning is status post hysterectomy.    Exercising: Yes.    walking Smoker:  no  Health Maintenance: Pap:  10/2012 normal History of abnormal Pap:  no MMG:  06/04/16 BIRADS 1 negative/density b Colonoscopy:  05/2011 normal BMD:   03/2013  Result  Normal with PCP TDaP:  2013 HIV and Hep C: 12/18/15 Negative Screening Labs: PCP takes care of labs   reports that she has never smoked. She has never used smokeless tobacco. She reports that she does not drink alcohol or use drugs.  Past Medical History:  Diagnosis Date  . Blood sugar increased 2008  . Cystocele   . Diabetes ([place])   . Fibroid   . Hyperlipidemia   . Hypertension   . Hypothyroid    Hashimoto's  thyroid  . Reflux esophagitis   . Urinary incontinence   . Uterine prolapse   . Uterine prolapse     Past Surgical History:  Procedure Laterality Date  . BILATERAL SALPINGECTOMY    . VAGINAL HYSTERECTOMY  11/08/07   Total; Cystocele repair; valut suspension. adenomycosis fibroid    Current Outpatient Prescriptions  Medication Sig Dispense Refill  . BAYER CONTOUR NEXT TEST test strip TEST BLOOD SUGAR QD UTD  3  . Calcium Carbonate (CALTRATE 600 PO) Take 600 mg by mouth daily.    . Cyanocobalamin (RA VITAMIN B-12 TR) 1000 MCG TBCR Take by mouth.    . fexofenadine (ALLEGRA) 180 MG tablet Take 180 mg by mouth daily.    Marland Kitchen levothyroxine (SYNTHROID, LEVOTHROID) 50 MCG tablet Take 50 mcg by mouth daily before breakfast.    . losartan (COZAAR) 50 MG tablet Take 50 mg by mouth daily.    . metFORMIN (GLUCOPHAGE) 500 MG tablet Take 500 mg by  mouth 2 (two) times daily with a meal.    . Multiple Vitamin (MULTI-VITAMINS) TABS Take 1 tablet by mouth daily.    . Multiple Vitamins-Minerals (MULTIVITAMIN PO) Take by mouth.    . Omega-3 1000 MG CAPS Take 1 tablet by mouth daily.    . Red Yeast Rice Extract 600 MG CAPS Take 1 tablet by mouth daily.    Marland Kitchen triamterene-hydrochlorothiazide (MAXZIDE-25) 37.5-25 MG per tablet Take 1 tablet by mouth daily.     No current facility-administered medications for this visit.     Family History  Problem Relation Age of Onset  . Thyroid disease Mother   . Osteoporosis Mother   . Diabetes Father   . Hypertension Father   . Stroke Father   . Colon cancer Other   . Breast cancer Neg Hx     ROS:  Pertinent items are noted in HPI.  Otherwise, a comprehensive ROS was negative.  Exam:   BP 118/68 (BP Location: Right Arm, Patient Position: Sitting, Cuff Size: Normal)   Pulse 72   Resp 16   Ht 5\' 4"  (1.626 m)   Wt 136 lb (61.7 kg)   BMI 23.34 kg/m     General appearance: alert, cooperative and  appears stated age Head: Normocephalic, without obvious abnormality, atraumatic Neck: no adenopathy, supple, symmetrical, trachea midline and thyroid normal to inspection and palpation Lungs: clear to auscultation bilaterally Breasts: normal appearance, no masses or tenderness, No nipple retraction or dimpling, No nipple discharge or bleeding, No axillary or supraclavicular adenopathy Heart: regular rate and rhythm Abdomen: soft, non-tender; no masses, no organomegaly Extremities: extremities normal, atraumatic, no cyanosis or edema Skin: Skin color, texture, turgor normal. No rashes or lesions Lymph nodes: Cervical, supraclavicular, and axillary nodes normal. No abnormal inguinal nodes palpated Neurologic: Grossly normal  Pelvic: External genitalia:  no lesions              Urethra:  normal appearing urethra with no masses, tenderness or lesions              Bartholins and Skenes: normal                  Vagina: normal appearing vagina with normal color and discharge, no lesions.  Good support.              Cervix:  Absent.              Pap taken: No. Bimanual Exam:  Uterus:  Absent.              Adnexa: no mass, fullness, tenderness              Rectal exam: Yes.  .  Confirms.              Anus:  normal sphincter tone, no lesions  Chaperone was present for exam.  Assessment:   Well woman visit with normal exam. Status post TVH and vault suspension.  Off ERT.  Plan: Mammogram screening discussed. Recommended self breast awareness. Pap and HR HPV as above. Guidelines for Calcium, Vitamin D, regular exercise program including cardiovascular and weight bearing exercise. Discussed WHI and risks related to ERT.  She will stay off the ERT.  Discussed herbal options as well as SSRIs, SNRIs, Catapress, and Gabapentin.   She will try Estroven. Labs with PCP.  Follow up annually and prn.      After visit summary provided.

## 2016-12-23 NOTE — Patient Instructions (Signed)
Menopause and Herbal Products What is menopause? Menopause is the normal time of life when menstrual periods decrease in frequency and eventually stop completely. This process can take several years for some women. Menopause is complete when you have had an absence of menstruation for a full year since your last menstrual period. It usually occurs between the ages of 48 and 55. It is not common for menopause to begin before the age of 40. During menopause, your body stops producing the female hormones estrogen and progesterone. Common symptoms associated with this loss of hormones (vasomotor symptoms) are:  Hot flashes.  Hot flushes.  Night sweats.  Other common symptoms and complications of menopause include:  Decrease in sex drive.  Vaginal dryness and thinning of the walls of the vagina. This can make sex painful.  Dryness of the skin and development of wrinkles.  Headaches.  Tiredness.  Irritability.  Memory problems.  Weight gain.  Bladder infections.  Hair growth on the face and chest.  Inability to reproduce offspring (infertility).  Loss of density in the bones (osteoporosis) increasing your risk for breaks (fractures).  Depression.  Hardening and narrowing of the arteries (atherosclerosis). This increases your risk of heart attack and stroke.  What treatment options are available? There are many treatment choices for menopause symptoms. The most common treatment is hormone replacement therapy. Many alternative therapies for menopause are emerging, including the use of herbal products. These supplements can be found in the form of herbs, teas, oils, tinctures, and pills. Common herbal supplements for menopause are made from plants that contain phytoestrogens. Phytoestrogens are compounds that occur naturally in plants and plant products. They act like estrogen in the body. Foods and herbs that contain phytoestrogens include:  Soy.  Flax seeds.  Red  clover.  Ginseng.  What menopause symptoms may be helped if I use herbal products?  Vasomotor symptoms. These may be helped by: ? Soy. Some studies show that soy may have a moderate benefit for hot flashes. ? Black cohosh. There is limited evidence indicating this may be beneficial for hot flashes.  Symptoms that are related to heart and blood vessel disease. These may be helped by soy. Studies have shown that soy can help to lower cholesterol.  Depression. This may be helped by: ? St. John's wort. There is limited evidence that shows this may help mild to moderate depression. ? Black cohosh. There is evidence that this may help depression and mood swings.  Osteoporosis. Soy may help to decrease bone loss that is associated with menopause and may prevent osteoporosis. Limited evidence indicates that red clover may offer some bone loss protection as well. Other herbal products that are commonly used during menopause lack enough evidence to support their use as a replacement for conventional menopause therapies. These products include evening primrose, ginseng, and red clover. What are the cases when herbal products should not be used during menopause? Do not use herbal products during menopause without your health care provider's approval if:  You are taking medicine.  You have a preexisting liver condition.  Are there any risks in my taking herbal products during menopause? If you choose to use herbal products to help with symptoms of menopause, keep in mind that:  Different supplements have different and unmeasured amounts of herbal ingredients.  Herbal products are not regulated the same way that medicines are.  Concentrations of herbs may vary depending on the way they are prepared. For example, the concentration may be different in a pill,   tea, oil, and tincture.  Little is known about the risks of using herbal products, particularly the risks of long-term use.  Some herbal  supplements can be harmful when combined with certain medicines.  Most commonly reported side effects of herbal products are mild. However, if used improperly, many herbal supplements can cause serious problems. Talk to your health care provider before starting any herbal product. If problems develop, stop taking the supplement and let your health care provider know. This information is not intended to replace advice given to you by your health care provider. Make sure you discuss any questions you have with your health care provider. Document Released: 11/04/2007 Document Revised: 04/14/2016 Document Reviewed: 10/31/2013 Elsevier Interactive Patient Education  2017 [employer]  EXERCISE AND DIET:  We recommended that you start or continue a regular exercise program for good health. Regular exercise means any activity that makes your heart beat faster and makes you sweat.  We recommend exercising at least 30 minutes per day at least 3 days a week, preferably 4 or 5.  We also recommend a diet low in fat and sugar.  Inactivity, poor dietary choices and obesity can cause diabetes, heart attack, stroke, and kidney damage, among others.    ALCOHOL AND SMOKING:  Women should limit their alcohol intake to no more than 7 drinks/beers/glasses of wine (combined, not each!) per week. Moderation of alcohol intake to this level decreases your risk of breast cancer and liver damage. And of course, no recreational drugs are part of a healthy lifestyle.  And absolutely no smoking or even second hand smoke. Most people know smoking can cause heart and lung diseases, but did you know it also contributes to weakening of your bones? Aging of your skin?  Yellowing of your teeth and nails?  CALCIUM AND VITAMIN D:  Adequate intake of calcium and Vitamin D are recommended.  The recommendations for exact amounts of these supplements seem to change often, but generally speaking 600 mg of calcium (either carbonate or citrate)  and 800 units of Vitamin D per day seems prudent. Certain women may benefit from higher intake of Vitamin D.  If you are among these women, your doctor will have told you during your visit.    PAP SMEARS:  Pap smears, to check for cervical cancer or precancers,  have traditionally been done yearly, although recent scientific advances have shown that most women can have pap smears less often.  However, every woman still should have a physical exam from her gynecologist every year. It will include a breast check, inspection of the vulva and vagina to check for abnormal growths or skin changes, a visual exam of the cervix, and then an exam to evaluate the size and shape of the uterus and ovaries.  And after 56 years of age, a rectal exam is indicated to check for rectal cancers. We will also provide age appropriate advice regarding health maintenance, like when you should have certain vaccines, screening for sexually transmitted diseases, bone density testing, colonoscopy, mammograms, etc.   MAMMOGRAMS:  All women over 40 years old should have a yearly mammogram. Many facilities now offer a "3D" mammogram, which may cost around $50 extra out of pocket. If possible,  we recommend you accept the option to have the 3D mammogram performed.  It both reduces the number of women who will be called back for extra views which then turn out to be normal, and it is better than the routine mammogram at detecting truly   abnormal areas.    COLONOSCOPY:  Colonoscopy to screen for colon cancer is recommended for all women at age 50.  We know, you hate the idea of the prep.  We agree, BUT, having colon cancer and not knowing it is worse!!  Colon cancer so often starts as a polyp that can be seen and removed at colonscopy, which can quite literally save your life!  And if your first colonoscopy is normal and you have no family history of colon cancer, most women don't have to have it again for 10 years.  Once every ten years, you  can do something that may end up saving your life, right?  We will be happy to help you get it scheduled when you are ready.  Be sure to check your insurance coverage so you understand how much it will cost.  It may be covered as a preventative service at no cost, but you should check your particular policy.       

## 2016-12-24 ENCOUNTER — Encounter: Payer: Self-pay | Admitting: *Deleted

## 2017-01-05 ENCOUNTER — Encounter: Payer: Self-pay | Admitting: General Surgery

## 2017-01-05 ENCOUNTER — Ambulatory Visit (INDEPENDENT_AMBULATORY_CARE_PROVIDER_SITE_OTHER): Payer: Managed Care, Other (non HMO) | Admitting: General Surgery

## 2017-01-05 VITALS — BP 108/68 | HR 72 | Resp 12 | Ht 64.0 in | Wt 136.0 lb

## 2017-01-05 DIAGNOSIS — L723 Sebaceous cyst: Secondary | ICD-10-CM

## 2017-01-05 NOTE — Patient Instructions (Addendum)
May shower May remove dressing in 2-3 days Return in one week nurse.  May use an Ice pack as needed for comfort

## 2017-01-05 NOTE — Progress Notes (Signed)
Patient ID: Carmen [name], female   DOB: Jul 17, 1960, 56 y.o.   MRN: [id number]  Chief Complaint  Patient presents with  . Cyst    axilla    HPI Carmen [name] is a 56 y.o. female.  Here today for evaluation of a sebaceous cyst right axilla referred by Dr Kary Kos. She states it has been there for over a year about the size of a "pea". She states it has started to bother her for the past couple of months, she states it is hard and larger than before. It has never drained.  HPI  Past Medical History:  Diagnosis Date  . Blood sugar increased 2008  . Cystocele   . Diabetes ([place])   . Fibroid   . Hyperlipidemia   . Hypertension   . Hypothyroid    Hashimoto's  thyroid  . Reflux esophagitis   . Urinary incontinence   . Uterine prolapse   . Uterine prolapse     Past Surgical History:  Procedure Laterality Date  . BILATERAL SALPINGECTOMY    . VAGINAL HYSTERECTOMY  11/08/07   Total; Cystocele repair; valut suspension. adenomycosis fibroid    Family History  Problem Relation Age of Onset  . Thyroid disease Mother   . Osteoporosis Mother   . Diabetes Father   . Hypertension Father   . Stroke Father   . Colon cancer Other   . Breast cancer Neg Hx     Social History Social History  Substance Use Topics  . Smoking status: Never Smoker  . Smokeless tobacco: Never Used  . Alcohol use No    Allergies  Allergen Reactions  . Atenolol Swelling  . Latex Itching    Current Outpatient Prescriptions  Medication Sig Dispense Refill  . BAYER CONTOUR NEXT TEST test strip TEST BLOOD SUGAR QD UTD  3  . Calcium Carbonate (CALTRATE 600 PO) Take 600 mg by mouth daily.    . Cyanocobalamin (RA VITAMIN B-12 TR) 1000 MCG TBCR Take by mouth.    . fexofenadine (ALLEGRA) 180 MG tablet Take 180 mg by mouth daily.    Marland Kitchen levothyroxine (SYNTHROID, LEVOTHROID) 50 MCG tablet Take 50 mcg by mouth daily before breakfast.    . losartan (COZAAR) 50 MG tablet Take 50 mg by mouth daily.    . metFORMIN  (GLUCOPHAGE) 500 MG tablet Take 500 mg by mouth 2 (two) times daily with a meal.    . Multiple Vitamins-Minerals (MULTIVITAMIN PO) Take by mouth.    . Omega-3 1000 MG CAPS Take 1 tablet by mouth daily.    . Red Yeast Rice Extract 600 MG CAPS Take 1 tablet by mouth daily.    Marland Kitchen triamterene-hydrochlorothiazide (MAXZIDE-25) 37.5-25 MG per tablet Take 1 tablet by mouth daily.     No current facility-administered medications for this visit.     Review of Systems Review of Systems  Constitutional: Negative.   Respiratory: Negative.   Cardiovascular: Negative.     Blood pressure 108/68, pulse 72, resp. rate 12, height 5\' 4"  (1.626 m), weight 136 lb (61.7 kg).  Physical Exam Physical Exam  Constitutional: She is oriented to person, place, and time. She appears well-developed and well-nourished.  HENT:  Mouth/Throat: Oropharynx is clear and moist.  Eyes: Conjunctivae are normal. No scleral icterus.  Neck: Neck supple.  Pulmonary/Chest:    Lymphadenopathy:    She has no cervical adenopathy.  Skin cyst right axilla  Neurological: She is alert and oriented to person, place, and time.  Skin: Skin  is warm and dry.  Psychiatric: Her behavior is normal.      Assessment    Symptomatic right axillary cyst.    Plan    Excision was recommended and accepted by the patient. The area was cleansed with alcohol followed by 10 mL of 0.5% Xylocaine with 0.25% Marcaine with 1-200,000 of epinephrine. ChloraPrep was applied to the skin. The area was excised through elliptical incision. 2 overlapping 5 mm areas consistent with sebaceous cyst were identified and excised completely. The skin defect was closed with interrupted 4-0 nylon simple sutures. Telfa and Tegaderm dressing applied. Ice pack provided. Postoperative wound care reviewed.      Return in one week nurse.  HPI, Physical Exam, Assessment and Plan have been scribed under the direction and in the presence of Robert Bellow,  MD. Karie Fetch, RN  I have completed the exam and reviewed the above documentation for accuracy and completeness.  I agree with the above.  Haematologist has been used and any errors in dictation or transcription are unintentional.  Hervey Ard, M.D., F.A.C.S.  Robert Bellow 01/05/2017, 8:57 PM

## 2017-01-11 ENCOUNTER — Telehealth: Payer: Self-pay

## 2017-01-11 NOTE — Telephone Encounter (Signed)
Notified patient as instructed, patient pleased. Discussed follow-up appointments, patient agrees  

## 2017-01-11 NOTE — Telephone Encounter (Signed)
-----   Message from Robert Bellow, MD sent at 01/09/2017  6:44 AM EDT ----- Please notify patient path skin cyst as expected.  F/U for suture removal as scheduled. Thanks.  ----- Message ----- From: Interface, Lab In [facility] Sent: 01/06/2017   5:31 PM To: Robert Bellow, MD

## 2017-01-12 ENCOUNTER — Encounter (INDEPENDENT_AMBULATORY_CARE_PROVIDER_SITE_OTHER): Payer: Self-pay | Admitting: Vascular Surgery

## 2017-01-12 ENCOUNTER — Ambulatory Visit (INDEPENDENT_AMBULATORY_CARE_PROVIDER_SITE_OTHER): Payer: Managed Care, Other (non HMO) | Admitting: *Deleted

## 2017-01-12 ENCOUNTER — Ambulatory Visit (INDEPENDENT_AMBULATORY_CARE_PROVIDER_SITE_OTHER): Payer: Managed Care, Other (non HMO) | Admitting: Vascular Surgery

## 2017-01-12 VITALS — BP 118/73 | HR 67 | Ht 64.0 in | Wt 135.6 lb

## 2017-01-12 DIAGNOSIS — L723 Sebaceous cyst: Secondary | ICD-10-CM

## 2017-01-12 DIAGNOSIS — I83891 Varicose veins of right lower extremities with other complications: Secondary | ICD-10-CM | POA: Diagnosis not present

## 2017-01-12 DIAGNOSIS — E118 Type 2 diabetes mellitus with unspecified complications: Secondary | ICD-10-CM

## 2017-01-12 DIAGNOSIS — I10 Essential (primary) hypertension: Secondary | ICD-10-CM | POA: Diagnosis not present

## 2017-01-12 NOTE — Progress Notes (Signed)
MRN : [id number]  Carmen [name] is a 56 y.o. ([birth year]) female who presents with chief complaint of  Chief Complaint  Patient presents with  . Follow-up  .  History of Present Illness: Patient returns today in follow up of venous disease.  She is continuing to do conservative management for her venous disease since her last visit about 4 months ago. She reports no significant change with continued pain overlying the prominent varicosities on the right medial calf and lower thigh area. She has no new ulceration or infection. A previously performed venous duplex demonstrated no evidence of deep venous thrombosis or superficial thrombophlebitis but significant venous reflux was present and a large right great saphenous vein branch which correlates with her site of symptoms.      Past Medical History:  Diagnosis Date  . Blood sugar increased 2008  . Cystocele   . Diabetes ([place])   . Fibroid   . Hyperlipidemia   . Hypertension   . Hypothyroid    Hashimoto's thyroid  . Reflux esophagitis   . Urinary incontinence   . Uterine prolapse   . Uterine prolapse          Past Surgical History:  Procedure Laterality Date  . BILATERAL SALPINGECTOMY    . VAGINAL HYSTERECTOMY  11/08/07   Total; Cystocele repair; valut suspension. adenomycosis fibroid         Family History  Problem Relation Age of Onset  . Thyroid disease Mother   . Osteoporosis Mother   . Diabetes Father   . Hypertension Father   . Stroke Father   . Colon cancer Other      Social History     Social History  Substance Use Topics  . Smoking status: Never Smoker  . Smokeless tobacco: Never Used  . Alcohol use No  No IVDU      Allergies  Allergen Reactions  . Atenolol Swelling  . Latex Itching          Current Outpatient Prescriptions  Medication Sig Dispense Refill  . BAYER CONTOUR NEXT TEST test strip TEST BLOOD SUGAR QD UTD  3  . Calcium  Carbonate (CALTRATE 600 PO) Take 600 mg by mouth daily.    . Cyanocobalamin (RA VITAMIN B-12 TR) 1000 MCG TBCR Take by mouth.    . estradiol (MINIVELLE) 0.0375 MG/24HR Place 1 patch onto the skin 2 (two) times a week. Apply anywhere on lower abdomen. 8 patch 11  . fexofenadine (ALLEGRA) 180 MG tablet Take 180 mg by mouth daily.    Marland Kitchen levothyroxine (SYNTHROID, LEVOTHROID) 50 MCG tablet Take 50 mcg by mouth daily before breakfast.    . losartan (COZAAR) 50 MG tablet Take 50 mg by mouth daily.    . metFORMIN (GLUCOPHAGE) 500 MG tablet Take 500 mg by mouth 2 (two) times daily with a meal.    . Multiple Vitamin (MULTI-VITAMINS) TABS Take 1 tablet by mouth daily.    . Multiple Vitamins-Minerals (MULTIVITAMIN PO) Take by mouth.    . Omega-3 1000 MG CAPS Take 1 tablet by mouth daily.    . Red Yeast Rice Extract 600 MG CAPS Take 1 tablet by mouth daily.    Marland Kitchen triamterene-hydrochlorothiazide (MAXZIDE-25) 37.5-25 MG per tablet Take 1 tablet by mouth daily.     No current facility-administered medications for this visit.       REVIEW OF SYSTEMS(Negative unless checked)  Constitutional: [] Weight loss[] Fever[] Chills Cardiac:[] Chest pain[] Chest pressure[] Palpitations [] Shortness of breath when laying flat [] Shortness of breath at  rest [] Shortness of breath with exertion. Vascular: [] Pain in legs with walking[] Pain in legsat rest[] Pain in legs when laying flat [] Claudication [] Pain in feet when walking [] Pain in feet at rest [] Pain in feet when laying flat [] History of DVT [] Phlebitis [x] Swelling in legs [x] Varicose veins [] Non-healing ulcers Pulmonary: [] Uses home oxygen [] Productive cough[] Hemoptysis [] Wheeze [] COPD [] Asthma Neurologic: [] Dizziness [] Blackouts [] Seizures [] History of stroke [] History of TIA[] Aphasia [] Temporary blindness[] Dysphagia [] Weaknessor numbness in arms [] Weakness or numbnessin  legs Musculoskeletal: [] Arthritis [] Joint swelling [] Joint pain [] Low back pain Hematologic:[] Easy bruising[] Easy bleeding [] Hypercoagulable state [] Anemic [] Hepatitis Gastrointestinal:[] Blood in stool[] Vomiting blood[] Gastroesophageal reflux/heartburn[] Abdominal pain Genitourinary: [] Chronic kidney disease [] Difficulturination [] Frequenturination [] Burning with urination[] Hematuria Skin: [] Rashes [] Ulcers [] Wounds Psychological: [] History of anxiety[] History of major depression.     Physical Examination  BP 118/73 (BP Location: Right Arm, Patient Position: Sitting, Cuff Size: Normal)   Pulse 67   Ht 5\' 4"  (1.626 m)   Wt 135 lb 9.6 oz (61.5 kg)   BMI 23.28 kg/m  Gen:  WD/WN, NAD Head: [place]/AT, No temporalis wasting. Ear/Nose/Throat: Hearing grossly intact, nares w/o erythema or drainage, trachea midline Eyes: Conjunctiva clear. Sclera non-icteric Neck: Supple.  No JVD.  Pulmonary:  Good air movement, no use of accessory muscles.  Cardiac: RRR, normal S1, S2 Vascular: prominent varicose veins in right medial calf and lower thigh measuring about 3 mm in size. Vessel Right Left  Radial Palpable Palpable                          PT Palpable Palpable  DP Palpable Palpable    Musculoskeletal: M/S 5/5 throughout.  No deformity or atrophy. Trace RLE edema. Neurologic: Sensation grossly intact in extremities.  Symmetrical.  Speech is fluent.  Psychiatric: Judgment intact, Mood & affect appropriate for pt's clinical situation. Dermatologic: No rashes or ulcers noted.  No cellulitis or open wounds.       Labs No results found for this or any previous visit (from the past 2160 hour(s)).  Radiology No results found.  Assessment/Plan Essential hypertension blood pressure control important in reducing the progression of atherosclerotic disease. On appropriate oral medications.   Diabetes ([place]) blood glucose control  important in reducing the progression of atherosclerotic disease. Also, involved in wound healing. On appropriate medications.  Symptomatic varicose veins, right A previously performed venous duplex demonstrated no evidence of deep venous thrombosis or superficial thrombophlebitis but significant venous reflux was present and a large right great saphenous vein branch which correlates with her site of symptoms. She is done appropriate conservative therapy with compression stockings, leg elevation, and anti-inflammatories as needed for pain since her last visit over 4 months ago. She has continued symptoms. This should be treated with foam sclerotherapy of the incompetent right great saphenous vein branches as I do not believe that would be amenable to laser ablation. Risks and benefits were discussed. The patient is agreeable to proceed    Leotis Pain, MD  01/12/2017 1:49 PM    This note was created with Dragon medical transcription system.  Any errors from dictation are purely unintentional

## 2017-01-12 NOTE — Patient Instructions (Signed)
Sclerotherapy Sclerotherapy is a procedure that is done to improve the appearance of varicose veins and spider veins and to help relieve aching, swelling, cramping, and pain in the legs. Varicose veins are veins that have become enlarged, bulging, and twisted due to a damaged valve that causes blood to collect (pool) in the veins. Spider veins are small varicose veins. Sclerotherapy usually works best for smaller spider and varicose veins. This procedure involves injecting a chemical into the vein to close it off. You may need more than one treatment to close a vein all the way. Sclerotherapy is usually performed on the legs because that is where varicose and spider veins most often occur. Tell a health care provider about:  Any allergies you have.  All medicines you are taking, including vitamins, herbs, eye drops, creams, and over-the-counter medicines.  Any blood disorders you have.  Any surgeries you have had.  Any medical conditions you have.  Whether you are pregnant or may be pregnant. What are the risks? Generally, this is a safe procedure. However, problems may occur, including:  Infection.  Bleeding.  Allergic reactions to medicines or dyes.  Blood clots.  Nerve damage.  Bruising and scarring.  Darkened skin around the area.  What happens before the procedure?  Do not use lotions or creams on your legs unless your health care provider approves.  Follow instructions from your health care provider about eating and drinking restrictions.  Do not use any products that contain nicotine or tobacco, such as cigarettes and e-cigarettes. If you need help quitting, ask your health care provider.  Ask your health care provider about: ? Changing or stopping your regular medicines. This is especially important if you are taking diabetes medicines or blood thinners. ? Taking medicines such as aspirin and ibuprofen. These medicines can thin your blood. Do not take these  medicines before your procedure if your health care provider instructs you not to.  You may have an ultrasound of the affected area to check for blood clots and to check blood flow.  In rare cases, you may have an X-ray procedure to check how blood flows through your veins (angiogram). For an angiogram, a dye is injected to outline your veins on X-rays. What happens during the procedure?  To lower your risk of infection: ? Your health care team will wash or sanitize their hands. ? Your skin will be washed with soap. ? Hair may be removed from the treatment area.  A small, thin needle will be used to inject a chemical (sclerosant) into your varicose vein. The sclerosant will irritate the lining of the vein and cause the vein to close below the injection site. You may feel some stinging, burning, or irritation.  The injection may be repeated for more than one varicose vein.  The injection area will be wrapped with elastic bandages. The procedure may vary among health care providers and hospitals. What happens after the procedure?  Your injection area will be wrapped with elastic bandages. If there is bleeding, the bandages may be changed.  Do not drive until your health care provider approves. You may need to wait 1-2 days before driving.  You will need to wear compression stockings for about a week, or as long as your health care provider recommends. Summary  Sclerotherapy is a procedure that is done to improve the appearance of varicose veins and spider veins and to help relieve aching, swelling, cramping, and pain in the legs.  A small, thin needle is   used to inject a chemical (sclerosant) into a spider vein or varicose vein to close it off.  Elastic bandages will be wrapped around the injection area after the procedure. This information is not intended to replace advice given to you by your health care provider. Make sure you discuss any questions you have with your health care  provider. Document Released: 07/07/2016 Document Revised: 07/07/2016 Document Reviewed: 07/07/2016 Elsevier Interactive Patient Education  2018 [employer]  

## 2017-01-12 NOTE — Progress Notes (Signed)
Patient came in today for a wound check right axillary.   The wound is clean, with no signs of infection noted. The sutures were removed and steri strips applied.

## 2017-01-12 NOTE — Assessment & Plan Note (Signed)
A previously performed venous duplex demonstrated no evidence of deep venous thrombosis or superficial thrombophlebitis but significant venous reflux was present and a large right great saphenous vein branch which correlates with her site of symptoms. She is done appropriate conservative therapy with compression stockings, leg elevation, and anti-inflammatories as needed for pain since her last visit over 4 months ago. She has continued symptoms. This should be treated with foam sclerotherapy of the incompetent right great saphenous vein branches as I do not believe that would be amenable to laser ablation. Risks and benefits were discussed. The patient is agreeable to proceed

## 2017-01-12 NOTE — Patient Instructions (Signed)
Patient to return as needed. 

## 2017-04-27 ENCOUNTER — Other Ambulatory Visit: Payer: Self-pay | Admitting: Family Medicine

## 2017-04-27 DIAGNOSIS — Z1231 Encounter for screening mammogram for malignant neoplasm of breast: Secondary | ICD-10-CM

## 2017-04-30 IMAGING — MG MM DIGITAL SCREENING BILAT W/ TOMO W/ CAD
9 of 13 series · 9 of 29 positions shown · non-contrast
Comparison: Previous exam(s).

CLINICAL DATA: Screening.

EXAM:
2D DIGITAL SCREENING BILATERAL MAMMOGRAM WITH CAD AND ADJUNCT TOMO

[L MLO (1 of 2)]
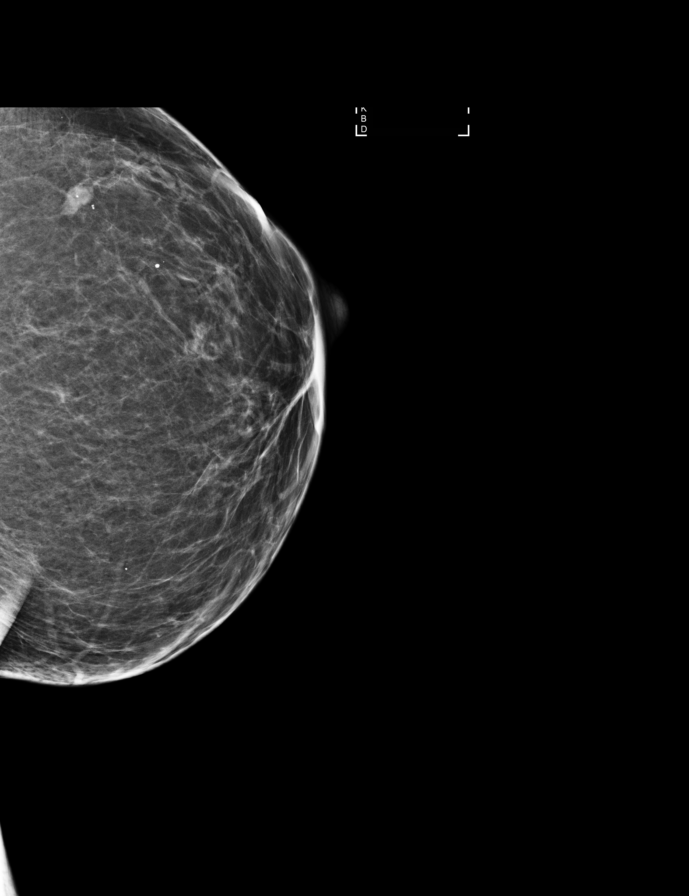

[R CC synth-2D]
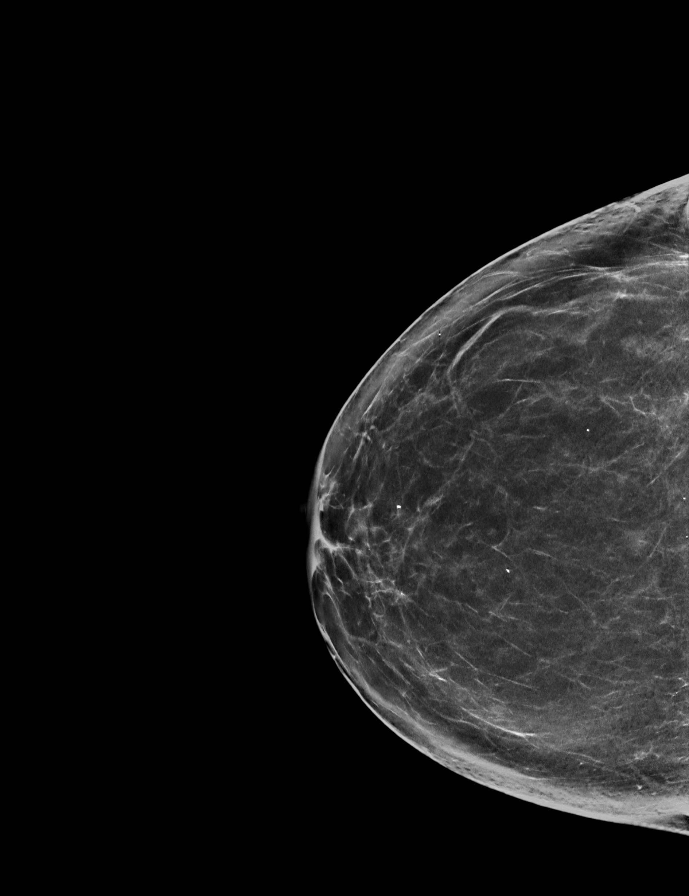

[R MLO]
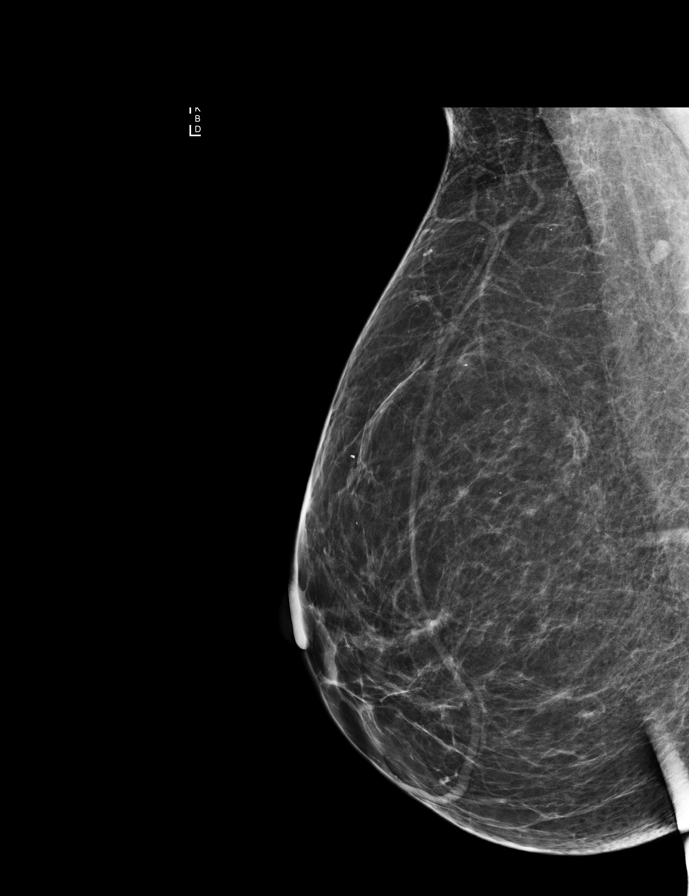

[R CC]
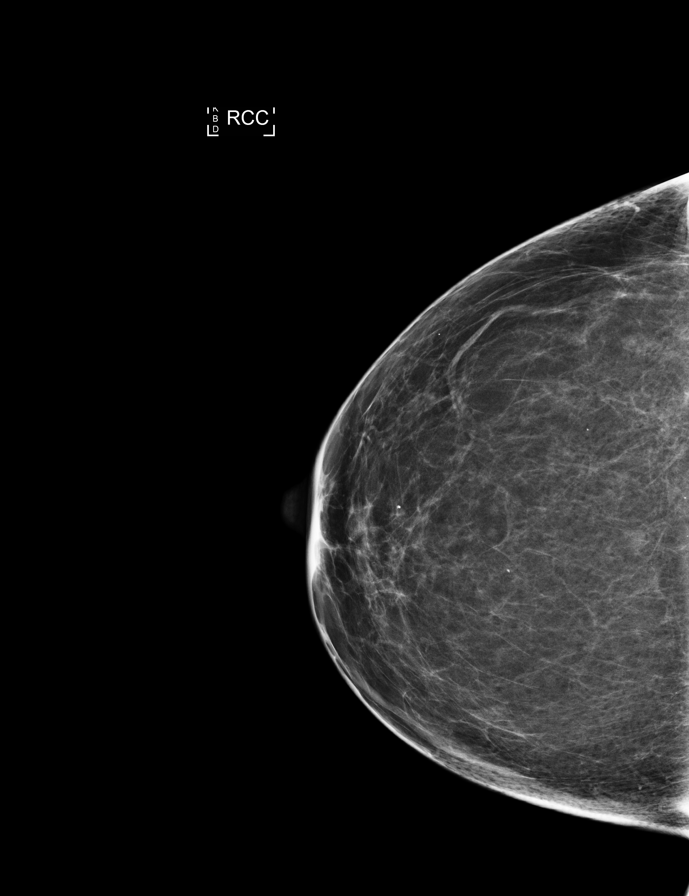

[R MLO synth-2D]
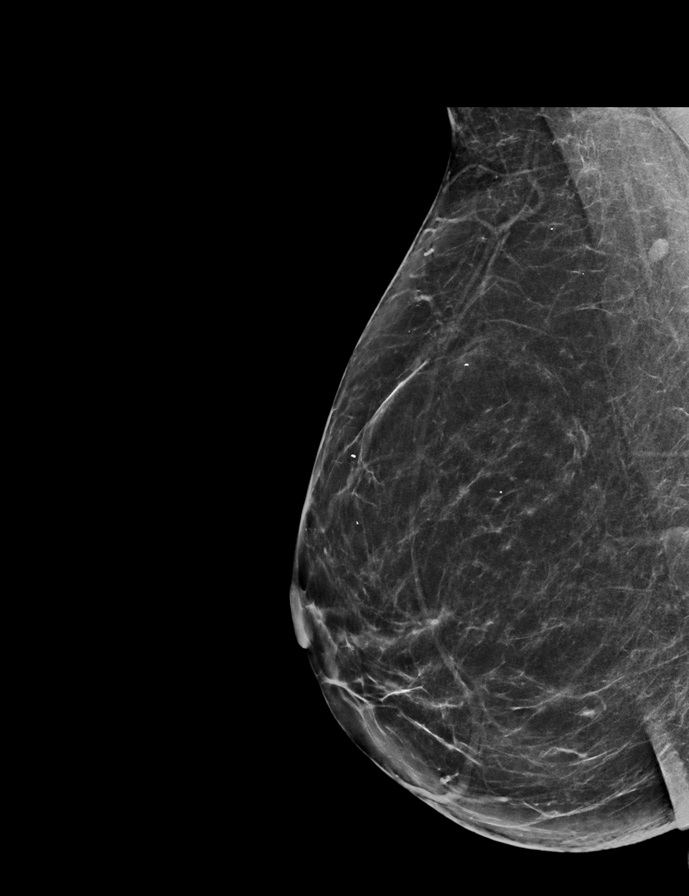

[L MLO synth-2D]
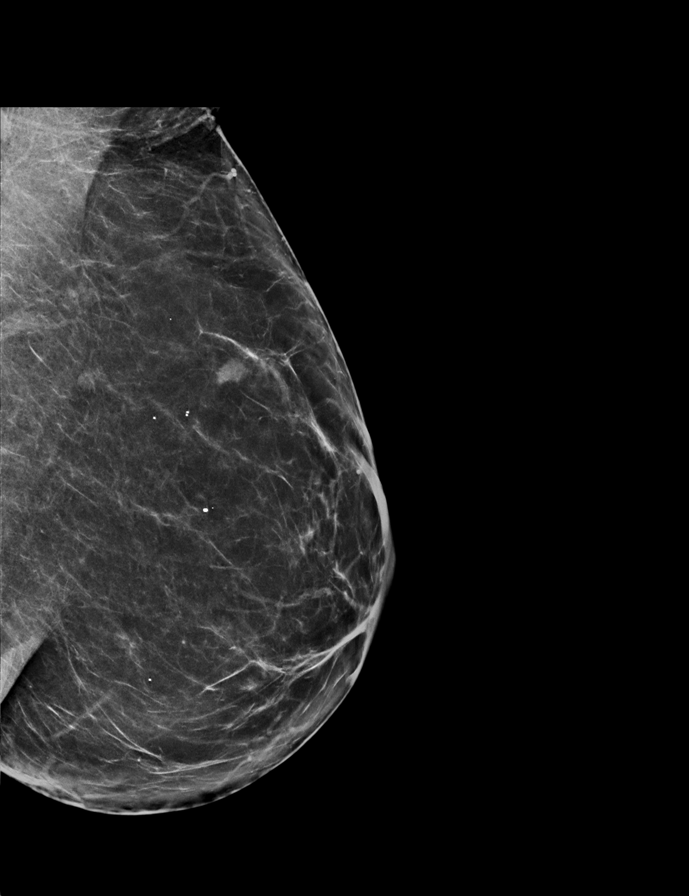

[L CC]
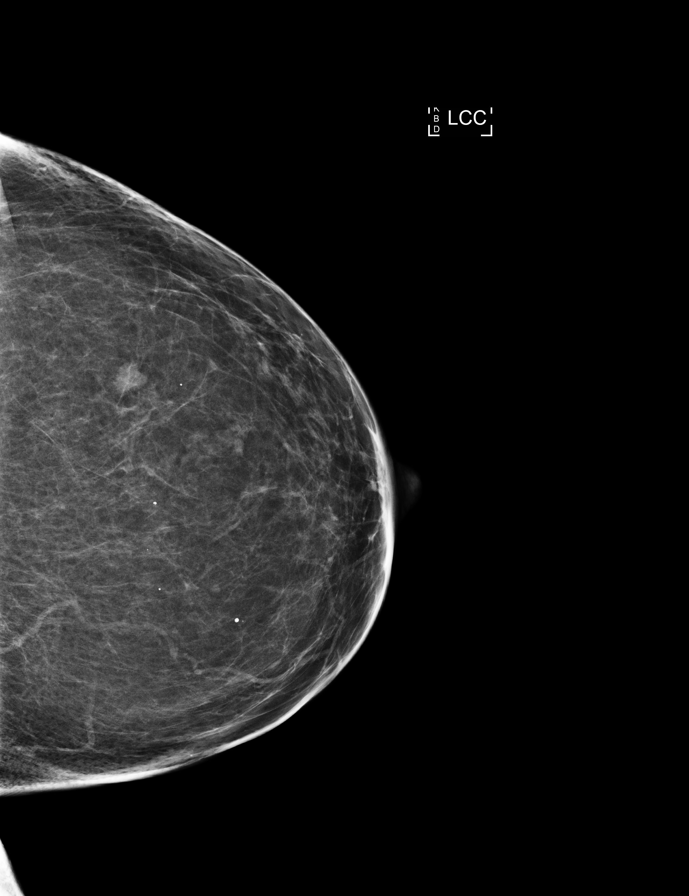

[L MLO (2 of 2)]
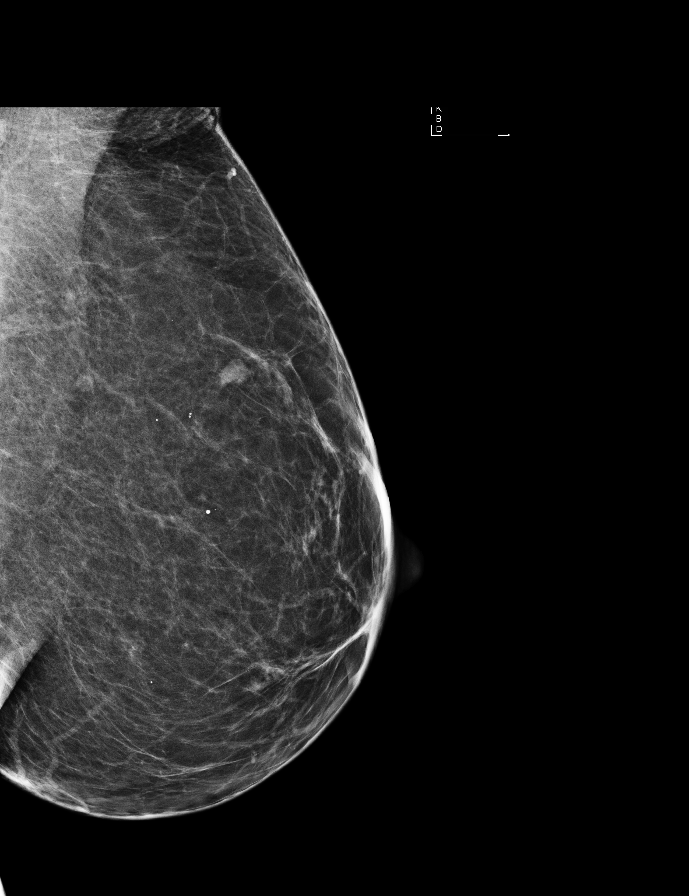

[L CC synth-2D]
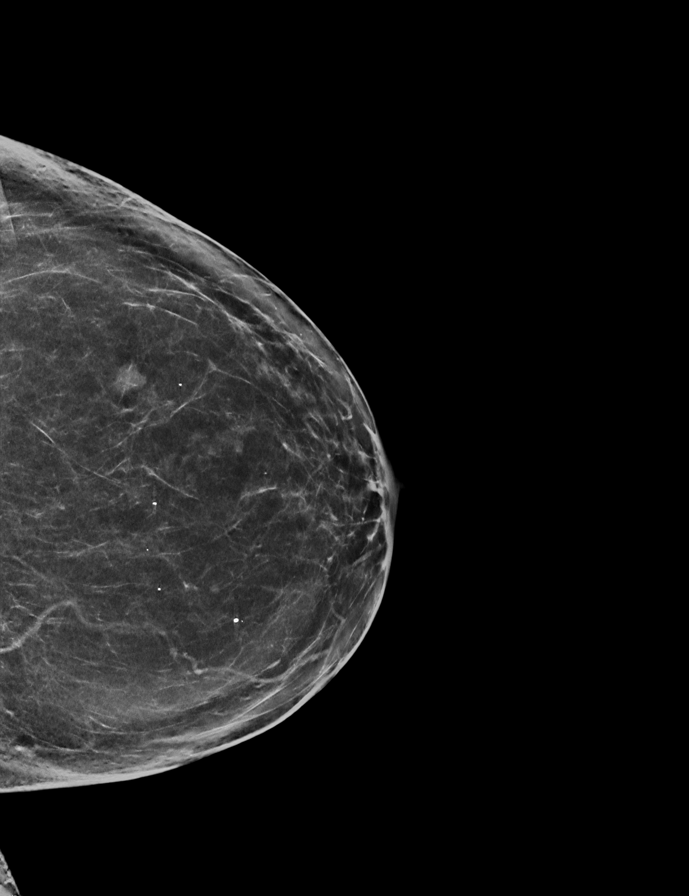

[9 of 29 positions shown; findings below may reference images not displayed]

ACR Breast Density Category b: There are scattered areas of
fibroglandular density.
FINDINGS: There are no findings suspicious for malignancy. Images were
processed with CAD.
IMPRESSION: No mammographic evidence of malignancy. A result letter of this
screening mammogram will be mailed directly to the patient.

RECOMMENDATION:
Screening mammogram in one year. (Code:97-6-RS4)

BI-RADS CATEGORY  1: Negative.

## 2017-06-08 ENCOUNTER — Ambulatory Visit (INDEPENDENT_AMBULATORY_CARE_PROVIDER_SITE_OTHER): Payer: Self-pay | Admitting: Vascular Surgery

## 2017-06-08 ENCOUNTER — Encounter (INDEPENDENT_AMBULATORY_CARE_PROVIDER_SITE_OTHER): Payer: Self-pay | Admitting: Vascular Surgery

## 2017-06-08 VITALS — BP 120/71 | HR 72 | Resp 14 | Ht 64.0 in | Wt 129.0 lb

## 2017-06-08 DIAGNOSIS — I83891 Varicose veins of right lower extremities with other complications: Secondary | ICD-10-CM

## 2017-06-08 NOTE — Progress Notes (Signed)
Carmen [name] is a 57 y.o.female who presents with painful varicose veins of the right leg  Past Medical History:  Diagnosis Date  . Blood sugar increased 2008  . Cystocele   . Diabetes ([place])   . Fibroid   . Hyperlipidemia   . Hypertension   . Hypothyroid    Hashimoto's  thyroid  . Reflux esophagitis   . Urinary incontinence   . Uterine prolapse   . Uterine prolapse     Past Surgical History:  Procedure Laterality Date  . BILATERAL SALPINGECTOMY    . VAGINAL HYSTERECTOMY  11/08/07   Total; Cystocele repair; valut suspension. adenomycosis fibroid    Current Outpatient Medications  Medication Sig Dispense Refill  . BAYER CONTOUR NEXT TEST test strip TEST BLOOD SUGAR QD UTD  3  . Calcium Carbonate (CALTRATE 600 PO) Take 600 mg by mouth daily.    . Cyanocobalamin (RA VITAMIN B-12 TR) 1000 MCG TBCR Take by mouth.    . fexofenadine (ALLEGRA) 180 MG tablet Take 180 mg by mouth daily.    Marland Kitchen levothyroxine (SYNTHROID, LEVOTHROID) 50 MCG tablet Take 50 mcg by mouth daily before breakfast.    . losartan (COZAAR) 50 MG tablet Take 50 mg by mouth daily.    . metFORMIN (GLUCOPHAGE) 500 MG tablet Take 500 mg by mouth 2 (two) times daily with a meal.    . Multiple Vitamins-Minerals (MULTIVITAMIN PO) Take by mouth.    . Omega-3 1000 MG CAPS Take 1 tablet by mouth daily.    . Red Yeast Rice Extract 600 MG CAPS Take 1 tablet by mouth daily.    Marland Kitchen triamterene-hydrochlorothiazide (MAXZIDE-25) 37.5-25 MG per tablet Take 1 tablet by mouth daily.     No current facility-administered medications for this visit.     Allergies  Allergen Reactions  . Atenolol Swelling  . Latex Itching    Indication: Patient presents with symptomatic varicose veins of the right lower extremity.  Procedure: Foam sclerotherapy was performed on the right lower extremity. Using ultrasound guidance, 5 mL of foam Sotradecol was used to inject the varicosities of the right lower extremity. Compression wraps were placed. The  patient tolerated the procedure well.

## 2017-06-09 ENCOUNTER — Ambulatory Visit
Admission: RE | Admit: 2017-06-09 | Discharge: 2017-06-09 | Disposition: A | Discharge status: Home / Self Care | Payer: Managed Care, Other (non HMO) | Source: Ambulatory Visit | Attending: Family Medicine | Admitting: Family Medicine

## 2017-06-09 DIAGNOSIS — Z1231 Encounter for screening mammogram for malignant neoplasm of breast: Secondary | ICD-10-CM

## 2017-08-17 ENCOUNTER — Encounter (INDEPENDENT_AMBULATORY_CARE_PROVIDER_SITE_OTHER): Payer: Self-pay | Admitting: Vascular Surgery

## 2017-08-17 ENCOUNTER — Ambulatory Visit (INDEPENDENT_AMBULATORY_CARE_PROVIDER_SITE_OTHER): Payer: Managed Care, Other (non HMO) | Admitting: Vascular Surgery

## 2017-08-17 VITALS — BP 107/65 | HR 75 | Resp 13 | Ht 64.0 in | Wt 129.0 lb

## 2017-08-17 DIAGNOSIS — I83891 Varicose veins of right lower extremities with other complications: Secondary | ICD-10-CM | POA: Diagnosis not present

## 2017-08-17 DIAGNOSIS — E118 Type 2 diabetes mellitus with unspecified complications: Secondary | ICD-10-CM

## 2017-08-17 DIAGNOSIS — I10 Essential (primary) hypertension: Secondary | ICD-10-CM | POA: Diagnosis not present

## 2017-08-17 NOTE — Assessment & Plan Note (Signed)
Significant reduction in the venous disease on the right leg after foam sclerotherapy.  Typical appearance post foam sclerotherapy with induration and discoloration.  No signs of infection.  At this point, I will not proceed with any further treatment unless she has recurrent or worsening symptoms.  Return to clinic as needed.

## 2017-08-17 NOTE — Progress Notes (Signed)
MRN : [id number]  Carmen [name] is a 57 y.o. (1960/06/27) female who presents with chief complaint of  Chief Complaint  Patient presents with  . Follow-up    Follow up from Jan. procedure (foam Sclero)  .  History of Present Illness: Patient returns today in follow up of painful varicose veins on her right calf.  She has undergone foam sclerotherapy for the prominent varicosities.  The area is still somewhat firm and discolored although the prominent varicosities are markedly reduced at this point.  Current Outpatient Medications  Medication Sig Dispense Refill  . BAYER CONTOUR NEXT TEST test strip TEST BLOOD SUGAR QD UTD  3  . Calcium Carbonate (CALTRATE 600 PO) Take 600 mg by mouth daily.    . fexofenadine (ALLEGRA) 180 MG tablet Take 180 mg by mouth daily.    Marland Kitchen levothyroxine (SYNTHROID, LEVOTHROID) 50 MCG tablet Take 50 mcg by mouth daily before breakfast.    . losartan (COZAAR) 50 MG tablet Take 50 mg by mouth daily.    . metFORMIN (GLUCOPHAGE) 500 MG tablet Take 500 mg by mouth 2 (two) times daily with a meal.    . Multiple Vitamins-Minerals (MULTIVITAMIN PO) Take by mouth.    . Omega-3 1000 MG CAPS Take 1 tablet by mouth daily.    . Red Yeast Rice Extract 600 MG CAPS Take 1 tablet by mouth daily.    Marland Kitchen triamterene-hydrochlorothiazide (MAXZIDE-25) 37.5-25 MG per tablet Take 1 tablet by mouth daily.    . Cyanocobalamin (RA VITAMIN B-12 TR) 1000 MCG TBCR Take by mouth.     No current facility-administered medications for this visit.     Past Medical History:  Diagnosis Date  . Blood sugar increased 2008  . Cystocele   . Diabetes ([place])   . Fibroid   . Hyperlipidemia   . Hypertension   . Hypothyroid    Hashimoto's  thyroid  . Reflux esophagitis   . Urinary incontinence   . Uterine prolapse   . Uterine prolapse     Past Surgical History:  Procedure Laterality Date  . BILATERAL SALPINGECTOMY    . VAGINAL HYSTERECTOMY  11/08/07   Total; Cystocele repair; valut  suspension. adenomycosis fibroid    Social History Social History   Tobacco Use  . Smoking status: Never Smoker  . Smokeless tobacco: Never Used  Substance Use Topics  . Alcohol use: No    Alcohol/week: 0.0 oz  . Drug use: No     Family History Family History  Problem Relation Age of Onset  . Thyroid disease Mother   . Osteoporosis Mother   . Diabetes Father   . Hypertension Father   . Stroke Father   . Colon cancer Other   . Breast cancer Neg Hx      Allergies  Allergen Reactions  . Atenolol Swelling  . Latex Itching     REVIEW OF SYSTEMS (Negative unless checked)  Constitutional: [] Weight loss  [] Fever  [] Chills Cardiac: [] Chest pain   [] Chest pressure   [] Palpitations   [] Shortness of breath when laying flat   [] Shortness of breath at rest   [] Shortness of breath with exertion. Vascular:  [] Pain in legs with walking   [] Pain in legs at rest   [] Pain in legs when laying flat   [] Claudication   [] Pain in feet when walking  [] Pain in feet at rest  [] Pain in feet when laying flat   [] History of DVT   [] Phlebitis   [x] Swelling in legs   [  x]Varicose veins   [] Non-healing ulcers Pulmonary:   [] Uses home oxygen   [] Productive cough   [] Hemoptysis   [] Wheeze  [] COPD   [] Asthma Neurologic:  [] Dizziness  [] Blackouts   [] Seizures   [] History of stroke   [] History of TIA  [] Aphasia   [] Temporary blindness   [] Dysphagia   [] Weakness or numbness in arms   [] Weakness or numbness in legs Musculoskeletal:  [x] Arthritis   [] Joint swelling   [] Joint pain   [] Low back pain Hematologic:  [] Easy bruising  [] Easy bleeding   [] Hypercoagulable state   [] Anemic   Gastrointestinal:  [] Blood in stool   [] Vomiting blood  [] Gastroesophageal reflux/heartburn   [] Abdominal pain Genitourinary:  [] Chronic kidney disease   [x] Difficult urination  [x] Frequent urination  [] Burning with urination   [] Hematuria Skin:  [] Rashes   [] Ulcers   [] Wounds Psychological:  [] History of anxiety   []  History of  major depression.  Physical Examination  BP 107/65 (BP Location: Right Arm)   Pulse 75   Resp 13   Ht 5\' 4"  (1.626 m)   Wt 58.5 kg (129 lb)   BMI 22.14 kg/m  Gen:  WD/WN, NAD. Appears younger than stated age. Head: [place]/AT, No temporalis wasting. Ear/Nose/Throat: Hearing grossly intact, nares w/o erythema or drainage, trachea midline Eyes: Conjunctiva clear. Sclera non-icteric Neck: Supple.  No JVD.  Pulmonary:  Good air movement, no use of accessory muscles.  Cardiac: RRR Vascular: Some bruising and firmness overlying the varicosities treated with foam sclerotherapy several weeks ago.  No erythema or drainage.  No signs of infection.  Varicosities are far less prominent at this point. Vessel Right Left  Radial Palpable Palpable                          PT Palpable Palpable  DP Palpable Palpable    Musculoskeletal: M/S 5/5 throughout.  No deformity or atrophy. No appreciable edema. Neurologic: Sensation grossly intact in extremities.  Symmetrical.  Speech is fluent.  Psychiatric: Judgment intact, Mood & affect appropriate for pt's clinical situation. Dermatologic: No rashes or ulcers noted.  No cellulitis or open wounds.       Labs No results found for this or any previous visit (from the past 2160 hour(s)).  Radiology No results found.   Assessment/Plan Essential hypertension blood pressure control important in reducing the progression of atherosclerotic disease. On appropriate oral medications.   Diabetes ([place]) blood glucose control important in reducing the progression of atherosclerotic disease. Also, involved in wound healing. On appropriate medications.   Symptomatic varicose veins, right Significant reduction in the venous disease on the right leg after foam sclerotherapy.  Typical appearance post foam sclerotherapy with induration and discoloration.  No signs of infection.  At this point, I will not proceed with any further treatment unless she has  recurrent or worsening symptoms.  Return to clinic as needed.    Leotis Pain, MD  08/17/2017 1:17 PM    This note was created with Dragon medical transcription system.  Any errors from dictation are purely unintentional

## 2017-12-28 NOTE — Progress Notes (Signed)
57 y.o. G16P1001 Married Caucasian female here for annual exam.    Having hot flashes for the last 5 years.  Occurring about the same.  Trying Equate herbal option.  Used a patch years ago and did not tolerate it.   Good bowel and bladder function overall.  No pelvic pressure.   Labs through PCP.  Last Hgb A1C 5.6.  PCP:   [place] Pink, MD Endocrinology:  [facility].   No LMP recorded. Patient has had a hysterectomy.           Sexually active: Yes.    The current method of family planning is status post hysterectomy.    Exercising: Yes.    walking Smoker:  no  Health Maintenance: Pap:  10/2012 normal History of abnormal Pap:  no MMG:  06/09/17 BIRADS 1 negative, cat B density Colonoscopy:  05/2011 normal BMD:   03/2013  Result  Normal with PCP TDaP:  2013 HIV and Hep C: 12/18/15 Negative Screening Labs: PCP takes care of labs   reports that she has never smoked. She has never used smokeless tobacco. She reports that she does not drink alcohol or use drugs.  Past Medical History:  Diagnosis Date  . Blood sugar increased 2008  . Cystocele   . Diabetes ([place])   . Fibroid   . Hyperlipidemia   . Hypertension   . Hypothyroid    Hashimoto's  thyroid  . Reflux esophagitis   . Urinary incontinence   . Uterine prolapse   . Uterine prolapse     Past Surgical History:  Procedure Laterality Date  . BILATERAL SALPINGECTOMY    . VAGINAL HYSTERECTOMY  11/08/07   Total; Cystocele repair; valut suspension. adenomycosis fibroid    Current Outpatient Medications  Medication Sig Dispense Refill  . BAYER CONTOUR NEXT TEST test strip TEST BLOOD SUGAR QD UTD  3  . Calcium Carbonate (CALTRATE 600 PO) Take 600 mg by mouth daily.    . fexofenadine (ALLEGRA) 180 MG tablet Take 180 mg by mouth daily.    Marland Kitchen levothyroxine (SYNTHROID, LEVOTHROID) 50 MCG tablet Take 50 mcg by mouth daily before breakfast.    . losartan (COZAAR) 50 MG tablet Take 50 mg by mouth daily.    . metFORMIN  (GLUCOPHAGE) 500 MG tablet Take 500 mg by mouth 2 (two) times daily with a meal.    . Misc Natural Products (ESTROVEN + ENERGY MAX STRENGTH PO) Take by mouth.    . Multiple Vitamins-Minerals (MULTIVITAMIN PO) Take by mouth.    . Omega-3 1000 MG CAPS Take 1 tablet by mouth daily.    . Red Yeast Rice Extract 600 MG CAPS Take 1 tablet by mouth daily.    Marland Kitchen triamterene-hydrochlorothiazide (MAXZIDE-25) 37.5-25 MG per tablet Take 1 tablet by mouth daily.     No current facility-administered medications for this visit.     Family History  Problem Relation Age of Onset  . Thyroid disease Mother   . Osteoporosis Mother   . Diabetes Father   . Hypertension Father   . Stroke Father   . Colon cancer Other   . Breast cancer Neg Hx     Review of Systems  Constitutional: Negative.   HENT: Negative.   Eyes: Negative.   Respiratory: Negative.   Cardiovascular: Negative.   Gastrointestinal: Negative.   Endocrine: Negative.   Genitourinary: Negative.   Musculoskeletal: Negative.   Skin: Negative.   Allergic/Immunologic: Negative.   Neurological: Negative.   Hematological: Negative.   Psychiatric/Behavioral: Negative.  Exam:   BP 104/76 (BP Location: Right Arm, Patient Position: Sitting)   Pulse 68   Ht 5\' 4"  (1.626 m)   Wt 132 lb (59.9 kg)   BMI 22.66 kg/m     General appearance: alert, cooperative and appears stated age Head: Normocephalic, without obvious abnormality, atraumatic Neck: no adenopathy, supple, symmetrical, trachea midline and thyroid normal to inspection and palpation Lungs: clear to auscultation bilaterally Breasts: normal appearance, no masses or tenderness, No nipple retraction or dimpling, No nipple discharge or bleeding, No axillary or supraclavicular adenopathy Heart: regular rate and rhythm Abdomen: soft, non-tender; no masses, no organomegaly Extremities: extremities normal, atraumatic, no cyanosis or edema Skin: Skin color, texture, turgor normal. No  rashes or lesions Lymph nodes: Cervical, supraclavicular, and axillary nodes normal. No abnormal inguinal nodes palpated Neurologic: Grossly normal  Pelvic: External genitalia:  no lesions              Urethra:  normal appearing urethra with no masses, tenderness or lesions              Bartholins and Skenes: normal                 Vagina: normal appearing vagina with normal color and discharge, no lesions.  Good support.               Cervix:  absent              Pap taken: No. Bimanual Exam:  Uterus: absent              Adnexa: no mass, fullness, tenderness              Rectal exam: Yes.  .  Confirms.              Anus:  normal sphincter tone, no lesions  Chaperone was present for exam.  Assessment:   Well woman visit with normal exam. Status post TVH and vault suspension.  Menopausal symptoms.   Plan: Mammogram screening. Recommended self breast awareness. Pap and HR HPV as above. Guidelines for Calcium, Vitamin D, regular exercise program including cardiovascular and weight bearing exercise. I discussed herbal remedies, SSRIs, and estrogen therapy.  I do not recommend estrogen treatment.  She will try herbal options.  We talked about vasomotor symptoms, bone health, and cardiovascular health. Menopause brochure to patient.  Follow up annually and prn.   After visit summary provided.

## 2017-12-29 ENCOUNTER — Encounter: Payer: Self-pay | Admitting: Obstetrics and Gynecology

## 2017-12-29 ENCOUNTER — Other Ambulatory Visit: Payer: Self-pay

## 2017-12-29 ENCOUNTER — Ambulatory Visit (INDEPENDENT_AMBULATORY_CARE_PROVIDER_SITE_OTHER): Payer: Managed Care, Other (non HMO) | Admitting: Obstetrics and Gynecology

## 2017-12-29 VITALS — BP 104/76 | HR 68 | Ht 64.0 in | Wt 132.0 lb

## 2017-12-29 DIAGNOSIS — Z01419 Encounter for gynecological examination (general) (routine) without abnormal findings: Secondary | ICD-10-CM | POA: Diagnosis not present

## 2017-12-29 NOTE — Patient Instructions (Signed)
EXERCISE AND DIET:  We recommended that you start or continue a regular exercise program for good health. Regular exercise means any activity that makes your heart beat faster and makes you sweat.  We recommend exercising at least 30 minutes per day at least 3 days a week, preferably 4 or 5.  We also recommend a diet low in fat and sugar.  Inactivity, poor dietary choices and obesity can cause diabetes, heart attack, stroke, and kidney damage, among others.    ALCOHOL AND SMOKING:  Women should limit their alcohol intake to no more than 7 drinks/beers/glasses of wine (combined, not each!) per week. Moderation of alcohol intake to this level decreases your risk of breast cancer and liver damage. And of course, no recreational drugs are part of a healthy lifestyle.  And absolutely no smoking or even second hand smoke. Most people know smoking can cause heart and lung diseases, but did you know it also contributes to weakening of your bones? Aging of your skin?  Yellowing of your teeth and nails?  CALCIUM AND VITAMIN D:  Adequate intake of calcium and Vitamin D are recommended.  The recommendations for exact amounts of these supplements seem to change often, but generally speaking 600 mg of calcium (either carbonate or citrate) and 800 units of Vitamin D per day seems prudent. Certain women may benefit from higher intake of Vitamin D.  If you are among these women, your doctor will have told you during your visit.    PAP SMEARS:  Pap smears, to check for cervical cancer or precancers,  have traditionally been done yearly, although recent scientific advances have shown that most women can have pap smears less often.  However, every woman still should have a physical exam from her gynecologist every year. It will include a breast check, inspection of the vulva and vagina to check for abnormal growths or skin changes, a visual exam of the cervix, and then an exam to evaluate the size and shape of the uterus and  ovaries.  And after 57 years of age, a rectal exam is indicated to check for rectal cancers. We will also provide age appropriate advice regarding health maintenance, like when you should have certain vaccines, screening for sexually transmitted diseases, bone density testing, colonoscopy, mammograms, etc.   MAMMOGRAMS:  All women over 40 years old should have a yearly mammogram. Many facilities now offer a "3D" mammogram, which may cost around $50 extra out of pocket. If possible,  we recommend you accept the option to have the 3D mammogram performed.  It both reduces the number of women who will be called back for extra views which then turn out to be normal, and it is better than the routine mammogram at detecting truly abnormal areas.    COLONOSCOPY:  Colonoscopy to screen for colon cancer is recommended for all women at age 50.  We know, you hate the idea of the prep.  We agree, BUT, having colon cancer and not knowing it is worse!!  Colon cancer so often starts as a polyp that can be seen and removed at colonscopy, which can quite literally save your life!  And if your first colonoscopy is normal and you have no family history of colon cancer, most women don't have to have it again for 10 years.  Once every ten years, you can do something that may end up saving your life, right?  We will be happy to help you get it scheduled when you are ready.    Be sure to check your insurance coverage so you understand how much it will cost.  It may be covered as a preventative service at no cost, but you should check your particular policy.     Menopause and Herbal Products What is menopause? Menopause is the normal time of life when menstrual periods decrease in frequency and eventually stop completely. This process can take several years for some women. Menopause is complete when you have had an absence of menstruation for a full year since your last menstrual period. It usually occurs between the ages of 48 and  55. It is not common for menopause to begin before the age of 40. During menopause, your body stops producing the female hormones estrogen and progesterone. Common symptoms associated with this loss of hormones (vasomotor symptoms) are:  Hot flashes.  Hot flushes.  Night sweats.  Other common symptoms and complications of menopause include:  Decrease in sex drive.  Vaginal dryness and thinning of the walls of the vagina. This can make sex painful.  Dryness of the skin and development of wrinkles.  Headaches.  Tiredness.  Irritability.  Memory problems.  Weight gain.  Bladder infections.  Hair growth on the face and chest.  Inability to reproduce offspring (infertility).  Loss of density in the bones (osteoporosis) increasing your risk for breaks (fractures).  Depression.  Hardening and narrowing of the arteries (atherosclerosis). This increases your risk of heart attack and stroke.  What treatment options are available? There are many treatment choices for menopause symptoms. The most common treatment is hormone replacement therapy. Many alternative therapies for menopause are emerging, including the use of herbal products. These supplements can be found in the form of herbs, teas, oils, tinctures, and pills. Common herbal supplements for menopause are made from plants that contain phytoestrogens. Phytoestrogens are compounds that occur naturally in plants and plant products. They act like estrogen in the body. Foods and herbs that contain phytoestrogens include:  Soy.  Flax seeds.  Red clover.  Ginseng.  What menopause symptoms may be helped if I use herbal products?  Vasomotor symptoms. These may be helped by: ? Soy. Some studies show that soy may have a moderate benefit for hot flashes. ? Black cohosh. There is limited evidence indicating this may be beneficial for hot flashes.  Symptoms that are related to heart and blood vessel disease. These may be  helped by soy. Studies have shown that soy can help to lower cholesterol.  Depression. This may be helped by: ? St. John's wort. There is limited evidence that shows this may help mild to moderate depression. ? Black cohosh. There is evidence that this may help depression and mood swings.  Osteoporosis. Soy may help to decrease bone loss that is associated with menopause and may prevent osteoporosis. Limited evidence indicates that red clover may offer some bone loss protection as well. Other herbal products that are commonly used during menopause lack enough evidence to support their use as a replacement for conventional menopause therapies. These products include evening primrose, ginseng, and red clover. What are the cases when herbal products should not be used during menopause? Do not use herbal products during menopause without your health care provider's approval if:  You are taking medicine.  You have a preexisting liver condition.  Are there any risks in my taking herbal products during menopause? If you choose to use herbal products to help with symptoms of menopause, keep in mind that:  Different supplements have different and unmeasured   amounts of herbal ingredients.  Herbal products are not regulated the same way that medicines are.  Concentrations of herbs may vary depending on the way they are prepared. For example, the concentration may be different in a pill, tea, oil, and tincture.  Little is known about the risks of using herbal products, particularly the risks of long-term use.  Some herbal supplements can be harmful when combined with certain medicines.  Most commonly reported side effects of herbal products are mild. However, if used improperly, many herbal supplements can cause serious problems. Talk to your health care provider before starting any herbal product. If problems develop, stop taking the supplement and let your health care provider know. This  information is not intended to replace advice given to you by your health care provider. Make sure you discuss any questions you have with your health care provider. Document Released: 11/04/2007 Document Revised: 04/14/2016 Document Reviewed: 10/31/2013 Elsevier Interactive Patient Education  2017 Elsevier Inc[employer]  

## 2018-04-05 ENCOUNTER — Ambulatory Visit (INDEPENDENT_AMBULATORY_CARE_PROVIDER_SITE_OTHER): Payer: Managed Care, Other (non HMO) | Admitting: Vascular Surgery

## 2018-04-05 ENCOUNTER — Encounter (INDEPENDENT_AMBULATORY_CARE_PROVIDER_SITE_OTHER): Payer: Self-pay | Admitting: Vascular Surgery

## 2018-04-05 VITALS — BP 115/70 | HR 70 | Resp 16 | Ht 64.0 in | Wt 131.6 lb

## 2018-04-05 DIAGNOSIS — I10 Essential (primary) hypertension: Secondary | ICD-10-CM

## 2018-04-05 DIAGNOSIS — I83891 Varicose veins of right lower extremities with other complications: Secondary | ICD-10-CM

## 2018-04-05 DIAGNOSIS — E119 Type 2 diabetes mellitus without complications: Secondary | ICD-10-CM

## 2018-04-05 NOTE — Progress Notes (Signed)
MRN : [id number]  Carmen [name] is a 57 y.o. (Jan 30, 1961) female who presents with chief complaint of  Chief Complaint  Patient presents with  . Follow-up  .  History of Present Illness: Patient returns today in follow up of her right leg to have [place] check some dark areas that were at the sites of previous sclerotherapy treatment earlier this year.  The varicosities associated in this area are markedly reduced, but the dark discoloration really has not gone away after sclerotherapy and foam sclerotherapy was performed.  Not painful.  No erythema or drainage.  No open wounds.  Current Outpatient Medications  Medication Sig Dispense Refill  . BAYER CONTOUR NEXT TEST test strip TEST BLOOD SUGAR QD UTD  3  . Calcium Carbonate (CALTRATE 600 PO) Take 600 mg by mouth daily.    . fexofenadine (ALLEGRA) 180 MG tablet Take 180 mg by mouth daily.    Marland Kitchen levothyroxine (SYNTHROID, LEVOTHROID) 50 MCG tablet Take 50 mcg by mouth daily before breakfast.    . losartan (COZAAR) 50 MG tablet Take 50 mg by mouth daily.    . metFORMIN (GLUCOPHAGE) 500 MG tablet Take 500 mg by mouth 2 (two) times daily with a meal.    . Misc Natural Products (ESTROVEN + ENERGY MAX STRENGTH PO) Take by mouth.    . Multiple Vitamins-Minerals (MULTIVITAMIN PO) Take by mouth.    . Omega-3 1000 MG CAPS Take 1 tablet by mouth daily.    . Red Yeast Rice Extract 600 MG CAPS Take 1 tablet by mouth daily.    Marland Kitchen triamterene-hydrochlorothiazide (MAXZIDE-25) 37.5-25 MG per tablet Take 1 tablet by mouth daily.     No current facility-administered medications for this visit.     Past Medical History:  Diagnosis Date  . Blood sugar increased 2008  . Cystocele   . Diabetes ([place])   . Fibroid   . Hyperlipidemia   . Hypertension   . Hypothyroid    Hashimoto's  thyroid  . Reflux esophagitis   . Urinary incontinence   . Uterine prolapse   . Uterine prolapse     Past Surgical History:  Procedure Laterality Date  . BILATERAL  SALPINGECTOMY    . VAGINAL HYSTERECTOMY  11/08/07   Total; Cystocele repair; valut suspension. adenomycosis fibroid    Social History        Tobacco Use  . Smoking status: Never Smoker  . Smokeless tobacco: Never Used  Substance Use Topics  . Alcohol use: No    Alcohol/week: 0.0 oz  . Drug use: No     Family History      Family History  Problem Relation Age of Onset  . Thyroid disease Mother   . Osteoporosis Mother   . Diabetes Father   . Hypertension Father   . Stroke Father   . Colon cancer Other   . Breast cancer Neg Hx          Allergies  Allergen Reactions  . Atenolol Swelling  . Latex Itching     REVIEW OF SYSTEMS (Negative unless checked)  Constitutional: [] Weight loss  [] Fever  [] Chills Cardiac: [] Chest pain   [] Chest pressure   [] Palpitations   [] Shortness of breath when laying flat   [] Shortness of breath at rest   [] Shortness of breath with exertion. Vascular:  [] Pain in legs with walking   [] Pain in legs at rest   [] Pain in legs when laying flat   [] Claudication   [] Pain in feet when walking  []   Pain in feet at rest  [] Pain in feet when laying flat   [] History of DVT   [] Phlebitis   [x] Swelling in legs   [x] Varicose veins   [] Non-healing ulcers Pulmonary:   [] Uses home oxygen   [] Productive cough   [] Hemoptysis   [] Wheeze  [] COPD   [] Asthma Neurologic:  [] Dizziness  [] Blackouts   [] Seizures   [] History of stroke   [] History of TIA  [] Aphasia   [] Temporary blindness   [] Dysphagia   [] Weakness or numbness in arms   [] Weakness or numbness in legs Musculoskeletal:  [x] Arthritis   [] Joint swelling   [] Joint pain   [] Low back pain Hematologic:  [] Easy bruising  [] Easy bleeding   [] Hypercoagulable state   [] Anemic   Gastrointestinal:  [] Blood in stool   [] Vomiting blood  [] Gastroesophageal reflux/heartburn   [] Abdominal pain Genitourinary:  [] Chronic kidney disease   [x] Difficult urination  [x] Frequent urination  [] Burning with urination    [] Hematuria Skin:  [] Rashes   [] Ulcers   [] Wounds Psychological:  [] History of anxiety   []  History of major depression.    Physical Examination  BP 115/70 (BP Location: Right Arm)   Pulse 70   Resp 16   Ht 5\' 4"  (1.626 m)   Wt 131 lb 9.6 oz (59.7 kg)   BMI 22.59 kg/m  Gen:  WD/WN, NAD Head: [place]/AT, No temporalis wasting. Ear/Nose/Throat: Hearing grossly intact, nares w/o erythema or drainage Eyes: Conjunctiva clear. Sclera non-icteric Neck: Supple.  Trachea midline Pulmonary:  Good air movement, no use of accessory muscles.  Cardiac: RRR, no JVD Vascular:  Vessel Right Left  Radial Palpable Palpable                          PT Palpable Palpable  DP Palpable Palpable   Gastrointestinal: soft, non-tender/non-distended. No guarding/reflex.  Musculoskeletal: M/S 5/5 throughout.  No deformity or atrophy.  Trace right lower extremity edema.  There is some dark spots and areas treated with sclerotherapy and foam sclerotherapy previously on the right medial calf and lower leg.  The varicosities on that leg are actually quite scattered at this point. Neurologic: Sensation grossly intact in extremities.  Symmetrical.  Speech is fluent.  Psychiatric: Judgment intact, Mood & affect appropriate for pt's clinical situation. Dermatologic: No rashes or ulcers noted.  No cellulitis or open wounds.       Labs No results found for this or any previous visit (from the past 2160 hour(s)).  Radiology No results found.  Assessment/Plan Essential hypertension blood pressure control important in reducing the progression of atherosclerotic disease. On appropriate oral medications.   Diabetes ([place]) blood glucose control important in reducing the progression of atherosclerotic disease. Also, involved in wound healing. On appropriate medications.  Symptomatic varicose veins, right Although the varicosities are markedly improved currently, there are dark discolored areas from the  sites of sclerotherapy.  These may fade some over time and she can use some vitamin E or Mederma to help resolve the skin changes.  She may always has some dark areas at this location.  No further role for sclerotherapy or other invasive vascular treatment at this time.    Leotis Pain, MD  04/05/2018 3:46 PM    This note was created with Dragon medical transcription system.  Any errors from dictation are purely unintentional

## 2018-04-05 NOTE — Assessment & Plan Note (Signed)
Although the varicosities are markedly improved currently, there are dark discolored areas from the sites of sclerotherapy.  These may fade some over time and she can use some vitamin E or Mederma to help resolve the skin changes.  She may always has some dark areas at this location.  No further role for sclerotherapy or other invasive vascular treatment at this time.

## 2018-04-26 ENCOUNTER — Other Ambulatory Visit: Payer: Self-pay | Admitting: Family Medicine

## 2018-04-26 DIAGNOSIS — Z1231 Encounter for screening mammogram for malignant neoplasm of breast: Secondary | ICD-10-CM

## 2018-06-15 ENCOUNTER — Ambulatory Visit
Admission: RE | Admit: 2018-06-15 | Discharge: 2018-06-15 | Disposition: A | Discharge status: Home / Self Care | Payer: Managed Care, Other (non HMO) | Source: Ambulatory Visit | Attending: Family Medicine | Admitting: Family Medicine

## 2018-06-15 DIAGNOSIS — Z1231 Encounter for screening mammogram for malignant neoplasm of breast: Secondary | ICD-10-CM | POA: Insufficient documentation

## 2018-12-05 ENCOUNTER — Telehealth: Payer: Self-pay | Admitting: Obstetrics and Gynecology

## 2018-12-05 NOTE — Telephone Encounter (Signed)
Patient is noticing hair loss and not sure if it's hormonal.

## 2018-12-05 NOTE — Telephone Encounter (Signed)
Patient states she has noticed some hair loss in last 2 weeks. Could this be hormone related? She has appointment with PCP in 2 weeks for evaluation and AEX with Dr.Silva 01-02-19. Advised patient hair loss could be hormonal, keep appt. with PCP to see what his recommendations and lab results are. Keep AEX with Dr.Silva. If necessary may need referral to dermatologist. She thanked me for my time.

## 2018-12-29 ENCOUNTER — Other Ambulatory Visit: Payer: Self-pay

## 2018-12-29 NOTE — Progress Notes (Signed)
58 y.o. G20P1101 Married Caucasian female here for annual exam.    Lost some hair 5 - 6 weeks ago suddenly. It is lessening.   Has seen her doctors, and stated everything is normal. Saw PCP, dermatologist, and endocrinologist.  Normal TFTs.  She was told it may be hormonal or stress related.  A1C 5.7.  States she is still having hot flashes at night and the daytime.   Husband is working from home.   PCP:  [place] Pink, MD   No LMP recorded. Patient has had a hysterectomy.           Sexually active: Yes.    The current method of family planning is status post hysterectomy.    Exercising: Yes.    walking Smoker:  no  Health Maintenance: Pap: NA.  Hysterectomy.  History of abnormal Pap:  no MMG: 06-15-18 Neg/density B/BiRads1 Colonoscopy: 05/2011 normal BMD: 5-6 yrs.ago  Result  Normal per patient TDaP:  2013 Gardasil:   N/A HIV: 12-18-15 NR Hep C: 12-18-15 Neg Screening Labs:   PCP.    reports that she has never smoked. She has never used smokeless tobacco. She reports that she does not drink alcohol or use drugs.  Past Medical History:  Diagnosis Date  . Blood sugar increased 2008  . Cystocele   . Diabetes ([place])   . Fibroid   . Hx of basal cell carcinoma 2005   scalp   . 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 Hyperlipidemia   . Hypertension   . Hypothyroid    Hashimoto's  thyroid  . Reflux esophagitis   . Urinary incontinence   . Uterine prolapse   . Uterine prolapse     Past Surgical History:  Procedure Laterality Date  . BILATERAL SALPINGECTOMY    . VAGINAL HYSTERECTOMY  11/08/07   Total; Cystocele repair; valut suspension. adenomycosis fibroid    Current Outpatient Medications  Medication Sig Dispense Refill  . BAYER CONTOUR NEXT TEST test strip TEST BLOOD SUGAR QD UTD  3  . Calcium Carbonate (CALTRATE 600 PO) Take 600 mg by mouth daily.    . fexofenadine (ALLEGRA) 180 MG tablet Take 180 mg by mouth daily.    Marland Kitchen levothyroxine  (SYNTHROID, LEVOTHROID) 50 MCG tablet Take 50 mcg by mouth daily before breakfast.    . losartan (COZAAR) 50 MG tablet Take 50 mg by mouth daily.    . metFORMIN (GLUCOPHAGE) 500 MG tablet Take 500 mg by mouth 2 (two) times daily with a meal.    . Misc Natural Products (ESTROVEN + ENERGY MAX STRENGTH PO) Take by mouth.    . Multiple Vitamins-Minerals (MULTIVITAMIN PO) Take by mouth.    . Omega-3 1000 MG CAPS Take 1 tablet by mouth daily.    . Red Yeast Rice Extract 600 MG CAPS Take 1 tablet by mouth daily.    Marland Kitchen triamterene-hydrochlorothiazide (MAXZIDE-25) 37.5-25 MG per tablet Take 1 tablet by mouth daily.     No current facility-administered medications for this visit.     Family History  Problem Relation Age of Onset  . Thyroid disease Mother   . Osteoporosis Mother   . Diabetes Father   . Hypertension Father   . Stroke Father   . Colon cancer Other   . Breast cancer Neg Hx     Review of Systems  Constitutional:       Hair loss  All other systems reviewed and are negative.   Exam:  There were no vitals taken for this visit.    General appearance: alert, cooperative and appears stated age Head: normocephalic, without obvious abnormality, atraumatic Neck: no adenopathy, supple, symmetrical, trachea midline and thyroid normal to inspection and palpation Lungs: clear to auscultation bilaterally Breasts: normal appearance, no masses or tenderness, No nipple retraction or dimpling, No nipple discharge or bleeding, No axillary adenopathy Heart: regular rate and rhythm Abdomen: soft, non-tender; no masses, no organomegaly Extremities: extremities normal, atraumatic, no cyanosis or edema Skin: skin color, texture, turgor normal. No rashes or lesions Lymph nodes: cervical, supraclavicular, and axillary nodes normal. Neurologic: grossly normal  Pelvic: External genitalia:  no lesions              No abnormal inguinal nodes palpated.              Urethra:  normal appearing urethra  with no masses, tenderness or lesions              Bartholins and Skenes: normal                 Vagina: normal appearing vagina with normal color and discharge, no lesions              Cervix:  absent              Pap taken: No. Bimanual Exam:  Uterus:  absent              Adnexa: no mass, fullness, tenderness              Rectal exam: Yes.  .  Confirms.              Anus:  normal sphincter tone, no lesions  Chaperone was present for exam.  Assessment:   Well woman visit with normal exam. Status post TVH and vault suspension.  Hair loss.  Menopausal symptoms.   Plan: Mammogram screening discussed. Self breast awareness reviewed. Pap and HR HPV as above. Guidelines for Calcium, Vitamin D, regular exercise program including cardiovascular and weight bearing exercise. We discussed various etiologies for hair loss.  I mentioned seeing a specialist for hair loss if this continues.  Will check testosterone level.  I discussed SSRIs and SNRIs for stress reduction and treatment of menopausal symptoms.  Follow up annually and prn.   After visit summary provided.

## 2019-01-02 ENCOUNTER — Other Ambulatory Visit: Payer: Self-pay

## 2019-01-02 ENCOUNTER — Ambulatory Visit (INDEPENDENT_AMBULATORY_CARE_PROVIDER_SITE_OTHER): Payer: Managed Care, Other (non HMO) | Admitting: Obstetrics and Gynecology

## 2019-01-02 ENCOUNTER — Encounter: Payer: Self-pay | Admitting: Obstetrics and Gynecology

## 2019-01-02 VITALS — BP 120/70 | HR 60 | Ht 64.0 in | Wt 133.4 lb

## 2019-01-02 DIAGNOSIS — L659 Nonscarring hair loss, unspecified: Secondary | ICD-10-CM | POA: Diagnosis not present

## 2019-01-02 DIAGNOSIS — Z01419 Encounter for gynecological examination (general) (routine) without abnormal findings: Secondary | ICD-10-CM | POA: Diagnosis not present

## 2019-01-02 NOTE — Patient Instructions (Signed)

## 2019-01-04 LAB — TESTOSTERONE, FREE, DIRECT
Testosterone, Free: 2.3 pg/mL (ref 0.0–4.2)
Testosterone, Total, LC/MS: 8.8 ng/dL

## 2019-05-02 ENCOUNTER — Other Ambulatory Visit: Payer: Self-pay | Admitting: Family Medicine

## 2019-05-02 DIAGNOSIS — Z1231 Encounter for screening mammogram for malignant neoplasm of breast: Secondary | ICD-10-CM

## 2019-06-20 ENCOUNTER — Ambulatory Visit
Admission: RE | Admit: 2019-06-20 | Discharge: 2019-06-20 | Disposition: A | Discharge status: Home / Self Care | Payer: Managed Care, Other (non HMO) | Source: Ambulatory Visit | Attending: Family Medicine | Admitting: Family Medicine

## 2019-06-20 DIAGNOSIS — Z1231 Encounter for screening mammogram for malignant neoplasm of breast: Secondary | ICD-10-CM

## 2020-01-04 ENCOUNTER — Ambulatory Visit (INDEPENDENT_AMBULATORY_CARE_PROVIDER_SITE_OTHER): Payer: Managed Care, Other (non HMO) | Admitting: Obstetrics and Gynecology

## 2020-01-04 ENCOUNTER — Other Ambulatory Visit: Payer: Self-pay

## 2020-01-04 ENCOUNTER — Encounter: Payer: Self-pay | Admitting: Obstetrics and Gynecology

## 2020-01-04 VITALS — BP 114/62 | HR 66 | Resp 16 | Ht 64.0 in | Wt 132.0 lb

## 2020-01-04 DIAGNOSIS — Z01419 Encounter for gynecological examination (general) (routine) without abnormal findings: Secondary | ICD-10-CM

## 2020-01-04 NOTE — Patient Instructions (Signed)

## 2020-01-04 NOTE — Progress Notes (Signed)
59 y.o. G67P1101 Married Caucasian female here for annual exam.    Still with hot flashes, which are manageable at this time.   Good bladder control but occasional urgency.  No urinary leakage.  Voiding well.   Husband with "blood cancer" and they are traveling back and forth to [facility].  She saw her PCP last month and had lab work. Last A1C was 5.9 and she will see endocrinology soon.   Received her Covid vaccine.   PCP: [place] Pink MD Endocrinology:  [facility] - Dr. Honor Junes.  No LMP recorded. Patient has had a hysterectomy.           Sexually active: No.  Husband with health issues The current method of family planning is status post hysterectomy.    Exercising: No.  walks when she can Smoker:  no  Health Maintenance: Pap: Hyst History of abnormal Pap:  no MMG: 06-20-19 3D/Neg/density A/Birads1 Colonoscopy: 05/2011 normal;next 05/2021 BMD: 2014  Result :Normal TDaP: 2013 Gardasil:   no HIV: 12-18-15 NR Hep C:12-18-15 Neg Screening Labs:  PCP.    reports that she has never smoked. She has never used smokeless tobacco. She reports that she does not drink alcohol and does not use drugs.  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 adenomyosis fibroid    Current Outpatient Medications  Medication Sig Dispense Refill   BAYER CONTOUR NEXT TEST test strip TEST BLOOD SUGAR QD UTD  3   Calcium Carbonate (CALTRATE 600 PO) Take 600 mg by mouth daily.     Clindamycin  Phosphate foam APP 1 GRAM EXT AA ON THE SKIN DAILY     fexofenadine (ALLEGRA) 180 MG tablet Take 180 mg by mouth daily.     levothyroxine (SYNTHROID, LEVOTHROID) 50 MCG tablet Take 50 mcg by mouth daily before breakfast.     losartan (COZAAR) 50 MG tablet Take 50 mg by mouth daily.     metFORMIN (GLUCOPHAGE) 500 MG tablet Take 500 mg by mouth 2 (two) times daily with a meal.     Microlet Lancets MISC 2 (two) times daily E11.9     Misc Natural Products (ESTROVEN + ENERGY MAX STRENGTH PO) Take by mouth.     Multiple Vitamins-Minerals (MULTIVITAMIN PO) Take by mouth.     PREVIDENT 5000 BOOSTER PLUS 1.1 % PSTE USE UTD QD     Red Yeast Rice Extract 600 MG CAPS Take 1 tablet by mouth daily.     triamterene-hydrochlorothiazide (MAXZIDE-25) 37.5-25 MG per tablet Take 1 tablet by mouth daily.     omega-3 acid ethyl esters (LOVAZA) 1 g capsule Take 2 capsules by mouth 2 (two) times daily.     No current facility-administered medications for this visit.    Family History  Problem Relation Age of Onset   Thyroid disease Mother    Osteoporosis Mother    Diabetes Father  Hypertension Father    Stroke Father    Colon cancer Other    Breast cancer Neg Hx     Review of Systems  All other systems reviewed and are negative.   Exam:   BP 114/62    Pulse 66    Resp 16    Ht 5\' 4"  (1.626 m)    Wt 132 lb (59.9 kg)    BMI 22.66 kg/m     General appearance: alert, cooperative and appears stated age Head: normocephalic, without obvious abnormality, atraumatic Neck: no adenopathy, supple, symmetrical, trachea midline and thyroid normal to inspection and palpation Lungs: clear to auscultation bilaterally Breasts: normal appearance, no masses or tenderness, No nipple retraction or dimpling, No nipple discharge or bleeding, No axillary adenopathy Heart: regular rate and rhythm Abdomen: soft, non-tender; no masses, no organomegaly Extremities: extremities normal, atraumatic, no cyanosis  or edema Skin: skin color, texture, turgor normal. No rashes or lesions Lymph nodes: cervical, supraclavicular, and axillary nodes normal. Neurologic: grossly normal  Pelvic: External genitalia:  no lesions              No abnormal inguinal nodes palpated.              Urethra:  normal appearing urethra with no masses, tenderness or lesions              Bartholins and Skenes: normal                 Vagina: normal appearing vagina with normal color and discharge, no lesions.  First degree cystocele with valsalva.              Cervix: absent              Pap taken: No. Bimanual Exam:  Uterus: absent              Adnexa: no mass, fullness, tenderness              Rectal exam: Yes.  .  Confirms.              Anus:  normal sphincter tone, no lesions  Chaperone was present for exam.  Assessment:   Well woman visit with normal exam. Status post TVH and vault suspension.  Ovaries remain.  Plan: Mammogram screening discussed. Self breast awareness reviewed. Pap and HR HPV not indicated. Guidelines for Calcium, Vitamin D, regular exercise program including cardiovascular and weight bearing exercise.   Follow up annually and prn.   After visit summary provided.

## 2020-05-07 ENCOUNTER — Other Ambulatory Visit: Payer: Self-pay | Admitting: Family Medicine

## 2020-05-07 DIAGNOSIS — Z1231 Encounter for screening mammogram for malignant neoplasm of breast: Secondary | ICD-10-CM

## 2020-05-13 ENCOUNTER — Encounter: Payer: Managed Care, Other (non HMO) | Admitting: Dermatology

## 2020-06-20 ENCOUNTER — Ambulatory Visit
Admission: RE | Admit: 2020-06-20 | Discharge: 2020-06-20 | Disposition: A | Discharge status: Home / Self Care | Payer: Managed Care, Other (non HMO) | Source: Ambulatory Visit | Attending: Family Medicine | Admitting: Family Medicine

## 2020-06-20 ENCOUNTER — Other Ambulatory Visit: Payer: Self-pay

## 2020-06-20 DIAGNOSIS — Z1231 Encounter for screening mammogram for malignant neoplasm of breast: Secondary | ICD-10-CM | POA: Insufficient documentation

## 2020-09-09 ENCOUNTER — Telehealth (INDEPENDENT_AMBULATORY_CARE_PROVIDER_SITE_OTHER): Payer: Self-pay | Admitting: Gastroenterology

## 2020-09-09 ENCOUNTER — Other Ambulatory Visit: Payer: Self-pay

## 2020-09-09 DIAGNOSIS — E785 Hyperlipidemia, unspecified: Secondary | ICD-10-CM | POA: Insufficient documentation

## 2020-09-09 DIAGNOSIS — E1169 Type 2 diabetes mellitus with other specified complication: Secondary | ICD-10-CM | POA: Insufficient documentation

## 2020-09-09 DIAGNOSIS — Z1211 Encounter for screening for malignant neoplasm of colon: Secondary | ICD-10-CM

## 2020-09-09 DIAGNOSIS — E042 Nontoxic multinodular goiter: Secondary | ICD-10-CM | POA: Insufficient documentation

## 2020-09-09 MED ORDER — NA SULFATE-K SULFATE-MG SULF 17.5-3.13-1.6 GM/177ML PO SOLN: 1.0000 | Freq: Once | ORAL | 0 refills | Status: AC

## 2020-09-09 NOTE — Progress Notes (Signed)
Gastroenterology Pre-Procedure Review  Request Date: 09/16/20 Requesting Physician: Dr. Enriqueta Shutter  PATIENT REVIEW QUESTIONS: The patient responded to the following health history questions as indicated:    1. Are you having any GI issues? no 2. Do you have a personal history of Polyps? no 3. Do you have a family history of Colon Cancer or Polyps? no 4. Diabetes Mellitus? no 5. Joint replacements in the past 12 months?no 6. Major health problems in the past 3 months?no 7. Any artificial heart valves, MVP, or defibrillator?no    MEDICATIONS & ALLERGIES:    Patient reports the following regarding taking any anticoagulation/antiplatelet therapy:   Plavix, Coumadin, Eliquis, Xarelto, Lovenox, Pradaxa, Brilinta, or Effient? no Aspirin? no  Patient confirms/reports the following medications:  Current Outpatient Medications  Medication Sig Dispense Refill  . BAYER CONTOUR NEXT TEST test strip TEST BLOOD SUGAR QD UTD  3  . Calcium Carbonate (CALTRATE 600 PO) Take 600 mg by mouth daily.    . Clindamycin Phosphate foam APP 1 GRAM EXT AA ON THE SKIN DAILY    . fexofenadine (ALLEGRA) 180 MG tablet Take 180 mg by mouth daily.    Marland Kitchen levothyroxine (SYNTHROID, LEVOTHROID) 50 MCG tablet Take 50 mcg by mouth daily before breakfast.    . losartan (COZAAR) 50 MG tablet Take 50 mg by mouth daily.    . metFORMIN (GLUCOPHAGE) 500 MG tablet Take 500 mg by mouth 2 (two) times daily with a meal.    . Microlet Lancets MISC 2 (two) times daily E11.9    . Misc Natural Products (ESTROVEN + ENERGY MAX STRENGTH PO) Take by mouth.    . Multiple Vitamins-Minerals (MULTIVITAMIN PO) Take by mouth.    . omega-3 acid ethyl esters (LOVAZA) 1 g capsule Take 2 capsules by mouth 2 (two) times daily.    Marland Kitchen PREVIDENT 5000 BOOSTER PLUS 1.1 % PSTE USE UTD QD    . Red Yeast Rice Extract 600 MG CAPS Take 1 tablet by mouth daily.    Marland Kitchen triamterene-hydrochlorothiazide (MAXZIDE-25) 37.5-25 MG per tablet Take 1 tablet by mouth daily.      No current facility-administered medications for this visit.    Patient confirms/reports the following allergies:  Allergies  Allergen Reactions  . Atenolol Swelling  . Latex Itching    No orders of the defined types were placed in this encounter.   AUTHORIZATION INFORMATION Primary Insurance: 1D#: Group #:  Secondary Insurance: 1D#: Group #:  SCHEDULE INFORMATION: Date: 09/16/20 Time: Location:[facility]

## 2020-09-16 ENCOUNTER — Ambulatory Visit
Admission: RE | Admit: 2020-09-16 | Discharge: 2020-09-16 | Disposition: A | Discharge status: Home / Self Care | Payer: Managed Care, Other (non HMO) | Source: Ambulatory Visit | Attending: Gastroenterology | Admitting: Gastroenterology

## 2020-09-16 ENCOUNTER — Ambulatory Visit: Payer: Managed Care, Other (non HMO) | Admitting: Anesthesiology

## 2020-09-16 ENCOUNTER — Encounter: Payer: Self-pay | Admitting: Gastroenterology

## 2020-09-16 ENCOUNTER — Encounter
Admission: RE | Disposition: A | Discharge status: Home / Self Care | Payer: Self-pay | Source: Ambulatory Visit | Attending: Gastroenterology

## 2020-09-16 DIAGNOSIS — Z1211 Encounter for screening for malignant neoplasm of colon: Secondary | ICD-10-CM

## 2020-09-16 DIAGNOSIS — K648 Other hemorrhoids: Secondary | ICD-10-CM | POA: Diagnosis not present

## 2020-09-16 DIAGNOSIS — Z888 Allergy status to other drugs, medicaments and biological substances status: Secondary | ICD-10-CM | POA: Diagnosis not present

## 2020-09-16 DIAGNOSIS — Z85828 Personal history of other malignant neoplasm of skin: Secondary | ICD-10-CM | POA: Diagnosis not present

## 2020-09-16 DIAGNOSIS — Z79899 Other long term (current) drug therapy: Secondary | ICD-10-CM | POA: Diagnosis not present

## 2020-09-16 DIAGNOSIS — K6289 Other specified diseases of anus and rectum: Secondary | ICD-10-CM | POA: Insufficient documentation

## 2020-09-16 DIAGNOSIS — Z91040 Latex allergy status: Secondary | ICD-10-CM | POA: Insufficient documentation

## 2020-09-16 DIAGNOSIS — D122 Benign neoplasm of ascending colon: Secondary | ICD-10-CM | POA: Diagnosis not present

## 2020-09-16 DIAGNOSIS — K635 Polyp of colon: Secondary | ICD-10-CM

## 2020-09-16 DIAGNOSIS — Z79890 Hormone replacement therapy (postmenopausal): Secondary | ICD-10-CM | POA: Diagnosis not present

## 2020-09-16 DIAGNOSIS — Z7984 Long term (current) use of oral hypoglycemic drugs: Secondary | ICD-10-CM | POA: Insufficient documentation

## 2020-09-16 HISTORY — PX: COLONOSCOPY WITH PROPOFOL: SHX5780

## 2020-09-16 LAB — GLUCOSE, CAPILLARY: Glucose-Capillary: 129 mg/dL — ABNORMAL HIGH (ref 70–99)

## 2020-09-16 SURGERY — COLONOSCOPY WITH PROPOFOL
Anesthesia: General

## 2020-09-16 MED ORDER — LIDOCAINE HCL (PF) 2 % IJ SOLN
INTRAMUSCULAR | Status: AC
  Filled 2020-09-16: qty 5

## 2020-09-16 MED ORDER — PROPOFOL 500 MG/50ML IV EMUL
INTRAVENOUS | Status: AC
  Filled 2020-09-16: qty 50

## 2020-09-16 MED ORDER — PROPOFOL 10 MG/ML IV BOLUS
INTRAVENOUS | Status: DC | PRN
  Administered 2020-09-16: 30 mg via INTRAVENOUS
  Administered 2020-09-16: 70 mg via INTRAVENOUS

## 2020-09-16 MED ORDER — PROPOFOL 500 MG/50ML IV EMUL
INTRAVENOUS | Status: DC | PRN
  Administered 2020-09-16: 200 ug/kg/min via INTRAVENOUS

## 2020-09-16 MED ORDER — LIDOCAINE 2% (20 MG/ML) 5 ML SYRINGE
INTRAMUSCULAR | Status: DC | PRN
  Administered 2020-09-16: 50 mg via INTRAVENOUS

## 2020-09-16 MED ORDER — SODIUM CHLORIDE 0.9 % IV SOLN
INTRAVENOUS | Status: DC
Start: 2020-09-16 — End: 2020-09-16
  Administered 2020-09-16: 20 mL/h via INTRAVENOUS

## 2020-09-16 NOTE — Op Note (Signed)
[facility] Patient Name: Alix Lahmann Procedure Date: 09/16/2020 8:42 AM MRN: [id number] Account #: [id number] Date of Birth: 11-Oct-1960 Admit Type: Outpatient Age: 60 Room: [facility] ENDO ROOM 2 Gender: Female Note Status: Finalized Procedure:             Colonoscopy Indications:           Screening for colorectal malignant neoplasm, Pt denies                         any colon cancer or history of polyps in the immediate                         family. Providers:             Lennette Bihari. Bonna Gains MD, MD Referring MD:          Irven Easterly. Kary Kos, MD (Referring MD) Medicines:             Monitored Anesthesia Care Complications:         No immediate complications. Procedure:             Pre-Anesthesia Assessment:                        - ASA Grade Assessment: II - A patient with mild                         systemic disease.                        - Prior to the procedure, a History and Physical was                         performed, and patient medications, allergies and                         sensitivities were reviewed. The patient's tolerance                         of previous anesthesia was reviewed.                        - The risks and benefits of the procedure and the                         sedation options and risks were discussed with the                         patient. All questions were answered and informed                         consent was obtained.                        - Patient identification and proposed procedure were                         verified prior to the procedure by the physician, the                         nurse, the  anesthesiologist, the anesthetist and the                         technician. The procedure was verified in the                         procedure room.                        After obtaining informed consent, the colonoscope was                         passed under direct vision. Throughout the procedure,                          the patient's blood pressure, pulse, and oxygen                         saturations were monitored continuously. The                         Colonoscope was introduced through the anus and                         advanced to the the terminal ileum. The colonoscopy                         was performed with ease. The patient tolerated the                         procedure well. The quality of the bowel preparation                         was good. Findings:      The perianal and digital rectal examinations were normal.      A 5 mm polyp was found in the ascending colon. The polyp was sessile.       The polyp was removed with a cold snare. Resection and retrieval were       complete.      The exam was otherwise without abnormality.      The rectum, sigmoid colon, descending colon, transverse colon, ascending       colon, cecum and ileum appeared normal.      Anal papilla(e) were hypertrophied.      Non-bleeding internal hemorrhoids were found during retroflexion. The       hemorrhoids were small. Impression:            - One 5 mm polyp in the ascending colon, removed with                         a cold snare. Resected and retrieved.                        - The examination was otherwise normal.                        - The rectum, sigmoid colon, descending colon,                         transverse  colon, ascending colon, cecum and terminal                         ileum are normal.                        - Anal papilla(e) were hypertrophied.                        - Non-bleeding internal hemorrhoids. Recommendation:        - Discharge patient to home (with escort).                        - Advance diet as tolerated.                        - Continue present medications.                        - Await pathology results.                        - Repeat colonoscopy date to be determined after                         pending pathology results are reviewed.                         - The findings and recommendations were discussed with                         the patient.                        - The findings and recommendations were discussed with                         the patient's family.                        - Return to primary care physician as previously                         scheduled.                        - High fiber diet. Procedure Code(s):     --- Professional ---                        [phone], Colonoscopy, flexible; with removal of                         tumor(s), polyp(s), or other lesion(s) by snare                         technique Diagnosis Code(s):     --- Professional ---                        Z12.11, Encounter for screening for malignant neoplasm  of colon                        K63.5, Polyp of colon CPT copyright 2019 American Medical Association. All rights reserved. The codes documented in this report are preliminary and upon coder review may  be revised to meet current compliance requirements.  Vonda Antigua, MD Margretta Sidle B. Bonna Gains MD, MD 09/16/2020 9:36:53 AM This report has been signed electronically. Number of Addenda: 0 Note Initiated On: 09/16/2020 8:42 AM Scope Withdrawal Time: 0 hours 18 minutes 6 seconds  Total Procedure Duration: 0 hours 30 minutes 4 seconds  Estimated Blood Loss:  Estimated blood loss: none.      [facility]

## 2020-09-16 NOTE — Anesthesia Preprocedure Evaluation (Signed)
Anesthesia Evaluation  Patient identified by MRN, date of birth, ID band Patient awake    Reviewed: Allergy & Precautions, H&P , NPO status , Patient's Chart, lab work & pertinent test results, reviewed documented beta blocker date and time   History of Anesthesia Complications Negative for: history of anesthetic complications  Airway Mallampati: I  TM Distance: >3 FB Neck ROM: full    Dental  (+) Dental Advidsory Given, Teeth Intact   Pulmonary neg pulmonary ROS,    Pulmonary exam normal breath sounds clear to auscultation       Cardiovascular Exercise Tolerance: Good hypertension, (-) angina(-) Past MI and (-) Cardiac Stents Normal cardiovascular exam(-) dysrhythmias (-) Valvular Problems/Murmurs Rhythm:regular Rate:Normal     Neuro/Psych negative neurological ROS  negative psych ROS   GI/Hepatic Neg liver ROS, GERD  Controlled,  Endo/Other  diabetesHypothyroidism   Renal/GU negative Renal ROS  negative genitourinary   Musculoskeletal   Abdominal   Peds  Hematology negative hematology ROS (+)   Anesthesia Other Findings Past Medical History: 2008: Blood sugar increased No date: Cystocele No date: Diabetes ([place]) No date: Fibroid 2005: Hx of basal cell carcinoma     Comment:  scalp  08/02/2007: Hx of squamous cell carcinoma     Comment:  Left post thigh No date: Hyperlipidemia No date: Hypertension No date: Hypothyroid     Comment:  Hashimoto's  thyroid No date: Reflux esophagitis No date: Urinary incontinence No date: Uterine prolapse No date: Uterine prolapse   Reproductive/Obstetrics negative OB ROS                             Anesthesia Physical Anesthesia Plan  ASA: II  Anesthesia Plan: General   Post-op Pain Management:    Induction: Intravenous  PONV Risk Score and Plan: 3 and TIVA and Propofol infusion  Airway Management Planned: Natural Airway and Nasal  Cannula  Additional Equipment:   Intra-op Plan:   Post-operative Plan:   Informed Consent: I have reviewed the patients History and Physical, chart, labs and discussed the procedure including the risks, benefits and alternatives for the proposed anesthesia with the patient or authorized representative who has indicated his/her understanding and acceptance.     Dental Advisory Given  Plan Discussed with: Anesthesiologist, CRNA and Surgeon  Anesthesia Plan Comments:         Anesthesia Quick Evaluation

## 2020-09-16 NOTE — Anesthesia Postprocedure Evaluation (Signed)
Anesthesia Post Note  Patient: Carmen [name]  Procedure(s) Performed: COLONOSCOPY WITH PROPOFOL (N/A )  Patient location during evaluation: Endoscopy Anesthesia Type: General Level of consciousness: awake and alert Pain management: pain level controlled Vital Signs Assessment: post-procedure vital signs reviewed and stable Respiratory status: spontaneous breathing, nonlabored ventilation, respiratory function stable and patient connected to nasal cannula oxygen Cardiovascular status: blood pressure returned to baseline and stable Postop Assessment: no apparent nausea or vomiting Anesthetic complications: no   No complications documented.   Last Vitals:  Vitals:   09/16/20 0942 09/16/20 0952  BP: 129/83 126/73  Pulse: 83 86  Resp: 13 14  Temp:    SpO2: 100% 100%    Last Pain:  Vitals:   09/16/20 0952  TempSrc:   PainSc: 0-No pain                 Martha Clan

## 2020-09-16 NOTE — H&P (Signed)
Carmen Antigua, MD [address], [place], [place], [place], [address] [id number], [place], [place], [place], [address] Phone: [phone]  Fax: [phone]  Primary Care Physician:  Maryland Pink, MD   Pre-Procedure History & Physical: HPI:  Carmen [name] is a 60 y.o. female is here for a colonoscopy.   Past Medical History:  Diagnosis Date  . Blood sugar increased 2008  . Cystocele   . Diabetes ([place])   . Fibroid   . Hx of basal cell carcinoma 2005   scalp   . 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 Hyperlipidemia   . Hypertension   . Hypothyroid    Hashimoto's  thyroid  . Reflux esophagitis   . Urinary incontinence   . Uterine prolapse   . Uterine prolapse     Past Surgical History:  Procedure Laterality Date  . BILATERAL SALPINGECTOMY    . VAGINAL HYSTERECTOMY  11/08/2007   Total; Cystocele repair; valut suspension. adenomyosis fibroid    Prior to Admission medications   Medication Sig Start Date End Date Taking? Authorizing Provider  BAYER CONTOUR NEXT TEST test strip TEST BLOOD SUGAR QD UTD 09/05/14  Yes [provider]  Calcium Carbonate (CALTRATE 600 PO) Take 600 mg by mouth daily.   Yes [provider]  Clindamycin Phosphate foam APP 1 GRAM EXT AA ON THE SKIN DAILY 11/16/18  Yes [provider]  fexofenadine (ALLEGRA) 180 MG tablet Take 180 mg by mouth daily.   Yes [provider]  levothyroxine (SYNTHROID, LEVOTHROID) 50 MCG tablet Take 50 mcg by mouth daily before breakfast.   Yes [provider]  losartan (COZAAR) 50 MG tablet Take 50 mg by mouth daily.   Yes [provider]  metFORMIN (GLUCOPHAGE) 500 MG tablet Take 500 mg by mouth 2 (two) times daily with a meal.   Yes [provider]  Microlet Lancets MISC 2 (two) times daily E11.9 09/01/17  Yes [provider]  Misc Natural Products (ESTROVEN + ENERGY MAX STRENGTH PO) Take by mouth.   Yes [provider]  Multiple Vitamins-Minerals (MULTIVITAMIN PO) Take by mouth.   Yes [provider]  Omega-3 Fatty Acids (FISH OIL) 1000 MG CAPS omega-3 acid ethyl esters 1 gram capsule 02/21/20  Yes [provider]  PREVIDENT 5000 BOOSTER PLUS 1.1 % PSTE USE UTD QD 12/11/18  Yes [provider]  Red Yeast Rice Extract 600 MG CAPS Take 1 tablet by mouth daily.   Yes [provider]  triamterene-hydrochlorothiazide (MAXZIDE-25) 37.5-25 MG per tablet Take 1 tablet by mouth daily.   Yes [provider]    Allergies as of 09/09/2020 - Review Complete 09/09/2020  Allergen Reaction Noted  . Atenolol Swelling 11/23/2002  . Latex Itching 11/28/2002    Family History  Problem Relation Age of Onset  . Thyroid disease Mother   . Osteoporosis Mother   . Diabetes Father   . Hypertension Father   . Stroke Father   . Colon cancer Other   . Breast cancer Neg Hx     Social History   Socioeconomic History  . Marital status: Married    Spouse name: Not on file  . Number of children: Not on file  . Years of education: Not on file  . Highest education level: Not on file  Occupational History  . Not on file  Tobacco Use  . Smoking status: Never Smoker  . Smokeless tobacco: Never Used  Vaping Use  . Vaping Use: Never used  Substance and Sexual Activity  . Alcohol use: No    Alcohol/week: 0.0 standard drinks  . Drug use: No  . Sexual activity: Yes    Partners: Male    Birth control/protection: Surgical    Comment: Hysterectomy  Other Topics Concern  . Not on file  Social History Narrative  . Not on file   Social Determinants of Health   Financial Resource Strain: Not on file  Food Insecurity: Not on file  Transportation Needs: Not on file  Physical Activity: Not on file  Stress: Not on file  Social Connections: Not on file  Intimate Partner Violence: Not on file    Review of Systems: See HPI, otherwise negative ROS  Physical  Exam: There were no vitals taken for this visit. General:   Alert,  pleasant and cooperative in NAD Head:  Normocephalic and atraumatic. Neck:  Supple; no masses or thyromegaly. Lungs:  Clear throughout to auscultation, normal respiratory effort.    Heart:  +S1, +S2, Regular rate and rhythm, No edema. Abdomen:  Soft, nontender and nondistended. Normal bowel sounds, without guarding, and without rebound.   Neurologic:  Alert and  oriented x4;  grossly normal neurologically.  Impression/Plan: Carmen [name] is here for a colonoscopy to be performed for average risk screening.  Risks, benefits, limitations, and alternatives regarding  colonoscopy have been reviewed with the patient.  Questions have been answered.  All parties agreeable.   Virgel Manifold, MD  09/16/2020, 8:06 AM

## 2020-09-16 NOTE — Transfer of Care (Signed)
Immediate Anesthesia Transfer of Care Note  Patient: Carmen [name]  Procedure(s) Performed: COLONOSCOPY WITH PROPOFOL (N/A )  Patient Location: Endoscopy Unit  Anesthesia Type:General  Level of Consciousness: awake  Airway & Oxygen Therapy: Patient Spontanous Breathing  Post-op Assessment: Post -op Vital signs reviewed and stable  Post vital signs: Reviewed and stable  Last Vitals:  Vitals Value Taken Time  BP 104/66 09/16/20 0932  Temp    Pulse 171 09/16/20 0932  Resp 13 09/16/20 0933  SpO2 100 % 09/16/20 0932  Vitals shown include unvalidated device data.  Last Pain:  Vitals:   09/16/20 0806  TempSrc: Temporal  PainSc: 0-No pain         Complications: No complications documented.

## 2020-09-17 ENCOUNTER — Encounter: Payer: Managed Care, Other (non HMO) | Admitting: Dermatology

## 2020-09-17 ENCOUNTER — Encounter: Payer: Self-pay | Admitting: Gastroenterology

## 2020-09-17 LAB — SURGICAL PATHOLOGY

## 2020-10-23 ENCOUNTER — Encounter: Payer: Managed Care, Other (non HMO) | Admitting: Dermatology

## 2020-10-30 ENCOUNTER — Other Ambulatory Visit: Payer: Self-pay

## 2020-10-30 ENCOUNTER — Encounter: Payer: Self-pay | Admitting: Dermatology

## 2020-10-30 ENCOUNTER — Ambulatory Visit (INDEPENDENT_AMBULATORY_CARE_PROVIDER_SITE_OTHER): Payer: Managed Care, Other (non HMO) | Admitting: Dermatology

## 2020-10-30 DIAGNOSIS — Z85828 Personal history of other malignant neoplasm of skin: Secondary | ICD-10-CM | POA: Diagnosis not present

## 2020-10-30 DIAGNOSIS — L578 Other skin changes due to chronic exposure to nonionizing radiation: Secondary | ICD-10-CM

## 2020-10-30 DIAGNOSIS — D229 Melanocytic nevi, unspecified: Secondary | ICD-10-CM

## 2020-10-30 DIAGNOSIS — D235 Other benign neoplasm of skin of trunk: Secondary | ICD-10-CM

## 2020-10-30 DIAGNOSIS — L739 Follicular disorder, unspecified: Secondary | ICD-10-CM | POA: Diagnosis not present

## 2020-10-30 DIAGNOSIS — D485 Neoplasm of uncertain behavior of skin: Secondary | ICD-10-CM

## 2020-10-30 DIAGNOSIS — Z1283 Encounter for screening for malignant neoplasm of skin: Secondary | ICD-10-CM | POA: Diagnosis not present

## 2020-10-30 DIAGNOSIS — D225 Melanocytic nevi of trunk: Secondary | ICD-10-CM

## 2020-10-30 DIAGNOSIS — L821 Other seborrheic keratosis: Secondary | ICD-10-CM

## 2020-10-30 DIAGNOSIS — L814 Other melanin hyperpigmentation: Secondary | ICD-10-CM

## 2020-10-30 DIAGNOSIS — D1800 Hemangioma unspecified site: Secondary | ICD-10-CM

## 2020-10-30 DIAGNOSIS — I8310 Varicose veins of unspecified lower extremity with inflammation: Secondary | ICD-10-CM

## 2020-10-30 MED ORDER — CLINDAMYCIN PHOSPHATE 1 % EX FOAM: CUTANEOUS | 6 refills | Status: DC

## 2020-10-30 NOTE — Progress Notes (Signed)
Follow-Up Visit   Subjective  Carmen [name] is a 60 y.o. female who presents for the following: Annual Exam (Patient here for TBSE. She has a history of folliculitis of the bil axilla, controlled by clindamycin foam. She also has a history of BCC of the crown scalp. No other spots of concern. ).  She has a mole by her L axilla that gets irritated that she would like removed.   The following portions of the chart were reviewed this encounter and updated as appropriate:       Review of Systems:  No other skin or systemic complaints except as noted in HPI or Assessment and Plan.  Objective  Well appearing patient in no apparent distress; mood and affect are within normal limits.  A full examination was performed including scalp, head, eyes, ears, nose, lips, neck, chest, axillae, abdomen, back, buttocks, bilateral upper extremities, bilateral lower extremities, hands, feet, fingers, toes, fingernails, and toenails. All findings within normal limits unless otherwise noted below.  Objective  Bil Axilla: Small pink papules on right axilla with scarring. L axilla clear  Objective  Left Anterior Axilla: 4.37mm flesh brown papule   Assessment & Plan   Skin cancer screening performed today.  Actinic Damage - chronic, secondary to cumulative UV radiation exposure/sun exposure over time - diffuse scaly erythematous macules with underlying dyspigmentation - Recommend daily broad spectrum sunscreen SPF 30+ to sun-exposed areas, reapply every 2 hours as needed.  - Recommend staying in the shade or wearing long sleeves, sun glasses (UVA+UVB protection) and wide brim hats (4-inch brim around the entire circumference of the hat). - Call for new or changing lesions.  Lentigines - Scattered tan macules - Due to sun exposure - Benign-appering, observe - Recommend daily broad spectrum sunscreen SPF 30+ to sun-exposed areas, reapply every 2 hours as needed. - Call for any changes  Melanocytic  Nevi - Tan-brown and/or pink-flesh-colored symmetric macules and papules - Benign appearing on exam today - Observation - Call clinic for new or changing moles - Recommend daily use of broad spectrum spf 30+ sunscreen to sun-exposed areas.   History of Basal Cell Carcinoma of the Skin - No evidence of recurrence today - Recommend regular full body skin exams - Recommend daily broad spectrum sunscreen SPF 30+ to sun-exposed areas, reapply every 2 hours as needed.  - Call if any new or changing lesions are noted between office visits  Hemangiomas - Red papules - Discussed benign nature - Observe - Call for any changes  Seborrheic Keratoses - Stuck-on, waxy, tan-brown papules and/or plaques  - Benign-appearing - Discussed benign etiology and prognosis. 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  Start PanOxyl 4% Creamy Wash or CeraVe Acne Wash in shower daily. Sample given.  Benzoyl peroxide can cause dryness and irritation of the skin. It can also bleach fabric. When used together with Aczone (dapsone) cream, it can stain the skin orange.   Neoplasm of uncertain behavior  of skin Left Anterior Axilla  Epidermal / dermal shaving  Lesion diameter (cm):  0.5 Informed consent: discussed and consent obtained   Patient was prepped and draped in usual sterile fashion: Area prepped with alcohol. Anesthesia: the lesion was anesthetized in a standard fashion   Anesthetic:  0.5%  bupivicaine w/ epinephrine 1-100,000 local infiltration Instrument used: flexible razor blade   Hemostasis achieved with: pressure, aluminum chloride and electrodesiccation   Outcome: patient tolerated procedure well   Post-procedure details: wound care instructions given   Post-procedure details comment:  Ointment and small bandage applied  Specimen 1 - Surgical pathology Differential Diagnosis: Irritated Nevus vs other Check Margins: No 4.17mm flesh brown papule  Return in about 1 year (around 10/30/2021) for TBSE.   IJamesetta Orleans, CMA, am acting as scribe for Brendolyn Patty, MD .  Documentation: I have reviewed the above documentation for accuracy and completeness, and I agree with the above.  Brendolyn Patty MD

## 2020-10-30 NOTE — Patient Instructions (Addendum)
Start CeraVe Acne Wash or PanOxyl 4% Creamy Wash to underarms in shower for folliculitis. Benzoyl peroxide can cause dryness and irritation of the skin. It can also bleach fabric. When used together with Aczone (dapsone) cream, it can stain the skin orange.  Wound Care Instructions  1. Cleanse wound gently with soap and water once a day then pat dry with clean gauze. Apply a thing coat of Petrolatum (petroleum jelly, "Vaseline") over the wound (unless you have an allergy to this). We recommend that you use a new, sterile tube of Vaseline. Do not pick or remove scabs. Do not remove the yellow or white "healing tissue" from the base of the wound.  2. Cover the wound with fresh, clean, nonstick gauze and secure with paper tape. You may use Band-Aids in place of gauze and tape if the would is small enough, but would recommend trimming much of the tape off as there is often too much. Sometimes Band-Aids can irritate the skin.  3. You should call the office for your biopsy report after 1 week if you have not already been contacted.  4. If you experience any problems, such as abnormal amounts of bleeding, swelling, significant bruising, significant pain, or evidence of infection, please call the office immediately.  5. FOR ADULT SURGERY PATIENTS: If you need something for pain relief you may take 1 extra strength Tylenol (acetaminophen) AND 2 Ibuprofen (200mg  each) together every 4 hours as needed for pain. (do not take these if you are allergic to them or if you have a reason you should not take them.) Typically, you may only need pain medication for 1 to 3 days.     If you have any questions or concerns for your doctor, please call our main line at [phone] and press option 4 to reach your doctor's medical assistant. If no one answers, please leave a voicemail as directed and we will return your call as soon as possible. Messages left after 4 pm will be answered the following business day.   You  may also send [place] a message via [place]. We typically respond to MyChart messages within 1-2 business days.  For prescription refills, please ask your pharmacy to contact our office. Our fax number is [phone].  If you have an urgent issue when the clinic is closed that cannot wait until the next business day, you can page your doctor at the number below.    Please note that while we do our best to be available for urgent issues outside of office hours, we are not available 24/7.   If you have an urgent issue and are unable to reach [place], you may choose to seek medical care at your doctor's office, retail clinic, urgent care center, or emergency room.  If you have a medical emergency, please immediately call 911 or go to the emergency department.  Pager Numbers  - Dr. Nehemiah Massed: [phone]  - Dr. Laurence Ferrari: [phone]  - Dr. Nicole Kindred: [phone]  In the event of inclement weather, please call our main line at [phone] for an update on the status of any delays or closures.  Dermatology Medication Tips: Please keep the boxes that topical medications come in in order to help keep track of the instructions about where and how to use these. Pharmacies typically print the medication instructions only on the boxes and not directly on the medication tubes.   If your medication is too expensive, please contact our office at [phone] option 4 or send [place] a message  through [place].   We are unable to tell what your co-pay for medications will be in advance as this is different depending on your insurance coverage. However, we may be able to find a substitute medication at lower cost or fill out paperwork to get insurance to cover a needed medication.   If a prior authorization is required to get your medication covered by your insurance company, please allow [place] 1-2 business days to complete this process.  Drug prices often vary depending on where the prescription is filled and some  pharmacies may offer cheaper prices.  The website www.goodrx.com contains coupons for medications through different pharmacies. The prices here do not account for what the cost may be with help from insurance (it may be cheaper with your insurance), but the website can give you the price if you did not use any insurance.  - You can print the associated coupon and take it with your prescription to the pharmacy.  - You may also stop by our office during regular business hours and pick up a GoodRx coupon card.  - If you need your prescription sent electronically to a different pharmacy, notify our office through [facility] or by phone at [phone] option 4.

## 2020-11-06 ENCOUNTER — Telehealth: Payer: Self-pay

## 2020-11-06 NOTE — Telephone Encounter (Signed)
Patient advised biopsy was benign nevus.

## 2020-11-06 NOTE — Telephone Encounter (Signed)
-----   Message from Brendolyn Patty, MD sent at 11/05/2020  6:22 PM EDT ----- Skin , left anterior axilla MELANOCYTIC NEVUS, INTRADERMAL TYPE, IRRITATED  Benign. Irritated Mole

## 2021-01-06 NOTE — Progress Notes (Signed)
60 y.o. G64P1101 Married Caucasian female here for annual exam.    Bladder control can vary.  Increased need to void after taking her diuretic.  Can have some urgency but not daily.  No usual leakage of urine with a cough, laugh, or sneeze.  Able to void well.  Bowel function is good.  Some discomfort with intercourse.   A1C is 5.8 in March, 2022 per patient.   Husband is doing ok with her cancer.  He [facility] care at [facility].   PCP:   Maryland Pink, MD  No LMP recorded. Patient has had a hysterectomy.           Sexually active: Yes.    The current method of family planning is status post hysterectomy.    Exercising: Yes.     Walking and PT for back/hip. Smoker:  no  Health Maintenance: Pap: Hyst History of abnormal Pap:  no MMG:  06-20-20 3D/Neg/Birads1 Colonoscopy: 09-16-20 polyp;next due 7 years BMD:  09/03/20 -  Result :Normal TDaP: 2013 Gardasil:   no HIV:12-18-15 NR Hep C: 12-18-15 Neg Screening Labs:  PCP/endocrinology.   reports that she has never smoked. She has never used smokeless tobacco. She reports that she does not drink alcohol and does not use drugs.  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 adenomyosis fibroid    Current Outpatient Medications  Medication Sig Dispense Refill    BAYER CONTOUR NEXT TEST test strip TEST BLOOD SUGAR QD UTD  3   Calcium Carbonate (CALTRATE 600 PO) Take 600 mg by mouth daily.     Clindamycin Phosphate foam APP 1 GRAM EXT AA ON THE SKIN DAILY 50 g 6   fexofenadine (ALLEGRA) 180 MG tablet Take 180 mg by mouth daily.     levothyroxine (SYNTHROID, LEVOTHROID) 50 MCG tablet Take 50 mcg by mouth daily before breakfast.     losartan (COZAAR) 50 MG tablet Take 50 mg by mouth daily.     metFORMIN (GLUCOPHAGE) 500 MG tablet Take 500 mg by mouth 2 (two) times daily with a meal.     Microlet Lancets MISC 2 (two) times daily E11.9     Misc Natural Products (ESTROVEN + ENERGY MAX STRENGTH PO) Take by mouth.     Multiple Vitamins-Minerals (MULTIVITAMIN PO) Take by mouth.     PREVIDENT 5000 BOOSTER PLUS 1.1 % PSTE USE UTD QD     Red Yeast Rice Extract 600 MG CAPS Take 1 tablet by mouth daily.     triamterene-hydrochlorothiazide (MAXZIDE-25) 37.5-25 MG per tablet Take 1 tablet by mouth daily.     VASCEPA 1 g capsule Take 2 g by mouth 2 (two) times daily.     No  current facility-administered medications for this visit.    Family History  Problem Relation Age of Onset   Thyroid disease Mother    Osteoporosis Mother    Diabetes Father    Hypertension Father    Stroke Father    Colon cancer Other    Breast cancer Neg Hx     Review of Systems  All other systems reviewed and are negative.  Exam:   BP 122/80   Pulse 87   Ht '5\' 4"'$  (1.626 m)   Wt 136 lb (61.7 kg)   SpO2 98%   BMI 23.34 kg/m     General appearance: alert, cooperative and appears stated age Head: normocephalic, without obvious abnormality, atraumatic Neck: no adenopathy, supple, symmetrical, trachea midline and thyroid normal to inspection and palpation Lungs: clear to auscultation bilaterally Breasts: normal appearance, no masses or tenderness, No nipple retraction or dimpling, No nipple discharge or bleeding, No axillary adenopathy Heart: regular rate and rhythm Abdomen:  soft, non-tender; no masses, no organomegaly Extremities: extremities normal, atraumatic, no cyanosis or edema Skin: skin color, texture, turgor normal. No rashes or lesions Lymph nodes: cervical, supraclavicular, and axillary nodes normal. Neurologic: grossly normal  Pelvic: External genitalia:  no lesions              No abnormal inguinal nodes palpated.              Urethra:  normal appearing urethra with no masses, tenderness or lesions              Bartholins and Skenes: normal                 Vagina: normal appearing vagina with normal color and discharge, no lesions.  First degree cystocele with Valsalva.              Cervix: absent              Pap taken: no Bimanual Exam:  Uterus:  absent              Adnexa: no mass, fullness, tenderness              Rectal exam: yes.  Confirms.              Anus:  normal sphincter tone, no lesions  Chaperone was present for exam:  Estill Bamberg, CMA  Assessment:   Well woman visit with gynecologic exam. Status post TVH and vault suspension, 2009. Ovaries remain. Vaginal atrophy.   Plan: Mammogram screening discussed. Self breast awareness reviewed. Pap and HR HPV as above. Guidelines for Calcium, Vitamin D, regular exercise program including cardiovascular and weight bearing exercise. We discussed water based lubricants and cooking oils. She declines vagina estrogen.  We discussed avoiding straining and heavy lifting to reduce risk of recurrent prolapse.  She will call for increased problems with urinary incontinence, voiding difficulty, difficulty with bowel movements, or vaginal protrusion.  Labs with PCP and endocrinology. Follow up annually and prn.    After visit summary provided.

## 2021-01-07 ENCOUNTER — Other Ambulatory Visit: Payer: Self-pay

## 2021-01-07 ENCOUNTER — Ambulatory Visit (INDEPENDENT_AMBULATORY_CARE_PROVIDER_SITE_OTHER): Payer: Managed Care, Other (non HMO) | Admitting: Obstetrics and Gynecology

## 2021-01-07 ENCOUNTER — Encounter: Payer: Self-pay | Admitting: Obstetrics and Gynecology

## 2021-01-07 VITALS — BP 122/80 | HR 87 | Ht 64.0 in | Wt 136.0 lb

## 2021-01-07 DIAGNOSIS — Z01419 Encounter for gynecological examination (general) (routine) without abnormal findings: Secondary | ICD-10-CM | POA: Diagnosis not present

## 2021-01-07 NOTE — Patient Instructions (Signed)

## 2021-05-12 ENCOUNTER — Other Ambulatory Visit: Payer: Self-pay | Admitting: Family Medicine

## 2021-05-12 DIAGNOSIS — Z1231 Encounter for screening mammogram for malignant neoplasm of breast: Secondary | ICD-10-CM

## 2021-06-24 ENCOUNTER — Ambulatory Visit
Admission: RE | Admit: 2021-06-24 | Discharge: 2021-06-24 | Disposition: A | Discharge status: Home / Self Care | Payer: Managed Care, Other (non HMO) | Source: Ambulatory Visit | Attending: Family Medicine | Admitting: Family Medicine

## 2021-06-24 ENCOUNTER — Other Ambulatory Visit: Payer: Self-pay

## 2021-06-24 DIAGNOSIS — Z1231 Encounter for screening mammogram for malignant neoplasm of breast: Secondary | ICD-10-CM | POA: Diagnosis present

## 2021-11-03 ENCOUNTER — Ambulatory Visit (INDEPENDENT_AMBULATORY_CARE_PROVIDER_SITE_OTHER): Payer: Managed Care, Other (non HMO) | Admitting: Dermatology

## 2021-11-03 ENCOUNTER — Encounter: Payer: Self-pay | Admitting: Dermatology

## 2021-11-03 DIAGNOSIS — L821 Other seborrheic keratosis: Secondary | ICD-10-CM

## 2021-11-03 DIAGNOSIS — D235 Other benign neoplasm of skin of trunk: Secondary | ICD-10-CM

## 2021-11-03 DIAGNOSIS — L578 Other skin changes due to chronic exposure to nonionizing radiation: Secondary | ICD-10-CM

## 2021-11-03 DIAGNOSIS — L739 Follicular disorder, unspecified: Secondary | ICD-10-CM

## 2021-11-03 DIAGNOSIS — Z1283 Encounter for screening for malignant neoplasm of skin: Secondary | ICD-10-CM

## 2021-11-03 DIAGNOSIS — L820 Inflamed seborrheic keratosis: Secondary | ICD-10-CM | POA: Diagnosis not present

## 2021-11-03 DIAGNOSIS — L814 Other melanin hyperpigmentation: Secondary | ICD-10-CM

## 2021-11-03 DIAGNOSIS — D229 Melanocytic nevi, unspecified: Secondary | ICD-10-CM

## 2021-11-03 DIAGNOSIS — D1800 Hemangioma unspecified site: Secondary | ICD-10-CM

## 2021-11-03 DIAGNOSIS — Z85828 Personal history of other malignant neoplasm of skin: Secondary | ICD-10-CM

## 2021-11-03 DIAGNOSIS — L442 Lichen striatus: Secondary | ICD-10-CM

## 2021-11-03 MED ORDER — TRIAMCINOLONE ACETONIDE 0.1 % EX CREA: 1.0000 "application " | TOPICAL_CREAM | Freq: Two times a day (BID) | CUTANEOUS | 2 refills | Status: AC | PRN

## 2021-11-03 MED ORDER — CLINDAMYCIN PHOSPHATE 1 % EX FOAM: CUTANEOUS | 6 refills | Status: DC

## 2021-11-03 NOTE — Patient Instructions (Addendum)
Cryotherapy Aftercare  Wash gently with soap and water everyday.   Apply Vaseline daily until healed.   Start Triamcinolone cream twice daily as needed to affected areas up to 2 weeks. Avoid applying to face, groin, and axilla. Use as directed. Long-term use can cause thinning of the skin.  Topical steroids (such as triamcinolone, fluocinolone, fluocinonide, mometasone, clobetasol, halobetasol, betamethasone, hydrocortisone) can cause thinning and lightening of the skin if they are used for too long in the same area. Your physician has selected the right strength medicine for your problem and area affected on the body. Please use your medication only as directed by your physician to prevent side effects.     Recommend daily broad spectrum sunscreen SPF 30+ to sun-exposed areas, reapply every 2 hours as needed. Call for new or changing lesions.  Staying in the shade or wearing long sleeves, sun glasses (UVA+UVB protection) and wide brim hats (4-inch brim around the entire circumference of the hat) are also recommended for sun protection.    Melanoma ABCDEs  Melanoma is the most dangerous type of skin cancer, and is the leading cause of death from skin disease.  You are more likely to develop melanoma if you: Have light-colored skin, light-colored eyes, or red or blond hair Spend a lot of time in the sun Tan regularly, either outdoors or in a tanning bed Have had blistering sunburns, especially during childhood Have a close family member who has had a melanoma Have atypical moles or large birthmarks  Early detection of melanoma is key since treatment is typically straightforward and cure rates are extremely high if we catch it early.   The first sign of melanoma is often a change in a mole or a new dark spot.  The ABCDE system is a way of remembering the signs of melanoma.  A for asymmetry:  The two halves do not match. B for border:  The edges of the growth are irregular. C for color:  A  mixture of colors are present instead of an even brown color. D for diameter:  Melanomas are usually (but not always) greater than 64m - the size of a pencil eraser. E for evolution:  The spot keeps changing in size, shape, and color.  Please check your skin once per month between visits. You can use a small mirror in front and a large mirror behind you to keep an eye on the back side or your body.   If you see any new or changing lesions before your next follow-up, please call to schedule a visit.  Please continue daily skin protection including broad spectrum sunscreen SPF 30+ to sun-exposed areas, reapplying every 2 hours as needed when you're outdoors.   Staying in the shade or wearing long sleeves, sun glasses (UVA+UVB protection) and wide brim hats (4-inch brim around the entire circumference of the hat) are also recommended for sun protection.     If You Need Anything After Your Visit  If you have any questions or concerns for your doctor, please call our main line at [phone] press option 4 to reach your doctor's medical assistant. If no one answers, please leave a voicemail as directed and we will return your call as soon as possible. Messages left after 4 pm will be answered the following business day.   You may also send [place] message via [place] We typically respond to MyChart messages within 1-2 business days.  For prescription refills, please ask your pharmacy to contact our office.  Our fax number is [phone].  If you have an urgent issue when the clinic is closed that cannot wait until the next business day, you can page your doctor at the number below.    Please note that while we do our best to be available for urgent issues outside of office hours, we are not available 24/7.   If you have an urgent issue and are unable to reach [place], you may choose to seek medical care at your doctor's office, retail clinic, urgent care center, or emergency room.  If you have a  medical emergency, please immediately call 911 or go to the emergency department.  Pager Numbers  - Dr. Nehemiah Massed: [phone]  - Dr. Laurence Ferrari: [phone]  - Dr. Nicole Kindred: [phone]  In the event of inclement weather, please call our main line at [phone] for an update on the status of any delays or closures.  Dermatology Medication Tips: Please keep the boxes that topical medications come in in order to help keep track of the instructions about where and how to use these. Pharmacies typically print the medication instructions only on the boxes and not directly on the medication tubes.   If your medication is too expensive, please contact our office at [phone] option 4 or send [place] a message through [place].   We are unable to tell what your co-pay for medications will be in advance as this is different depending on your insurance coverage. However, we may be able to find a substitute medication at lower cost or fill out paperwork to get insurance to cover a needed medication.   If a prior authorization is required to get your medication covered by your insurance company, please allow [place] 1-2 business days to complete this process.  Drug prices often vary depending on where the prescription is filled and some pharmacies may offer cheaper prices.  The website www.goodrx.com contains coupons for medications through different pharmacies. The prices here do not account for what the cost may be with help from insurance (it may be cheaper with your insurance), but the website can give you the price if you did not use any insurance.  - You can print the associated coupon and take it with your prescription to the pharmacy.  - You may also stop by our office during regular business hours and pick up a GoodRx coupon card.  - If you need your prescription sent electronically to a different pharmacy, notify our office through [facility] or by phone at [phone] option 4.     Si  Usted Necesita Algo Despus de Su Visita  Tambin puede enviarnos un mensaje a travs de Pharmacist, community. Por lo general respondemos a los mensajes de MyChart en el transcurso de 1 a 2 das hbiles.  Para renovar recetas, por favor pida a su farmacia que se ponga en contacto con nuestra oficina. Harland Dingwall de fax es [place] [phone].  Si tiene un asunto urgente cuando la clnica est cerrada y que no puede esperar hasta el siguiente da hbil, puede llamar/localizar a su doctor(a) al nmero que aparece a continuacin.   Por favor, tenga en cuenta que aunque hacemos todo lo posible para estar disponibles para asuntos urgentes fuera del horario de [place], no estamos disponibles las 24 horas del da, los 7 das de la [place].   Si tiene un problema urgente y no puede comunicarse con nosotros, puede optar por buscar atencin mdica  en el consultorio de su doctor(a), en una clnica privada, en un  centro de atencin urgente o en una sala de emergencias.  Si tiene Engineering geologist, por favor llame inmediatamente al 911 o vaya a la sala de emergencias.  Nmeros de bper  - Dr. Nehemiah Massed: [phone]  - Dra. Moye: [phone]  - Dra. Nicole Kindred: [phone]  En caso de inclemencias del [place], por favor llame a Johnsie Kindred principal al [phone] para una actualizacin sobre el [place] de cualquier retraso o cierre.  Consejos para la medicacin en dermatologa: Por favor, guarde las cajas en las que vienen los medicamentos de uso tpico para ayudarle a seguir las instrucciones sobre dnde y cmo usarlos. Las farmacias generalmente imprimen las instrucciones del medicamento slo en las cajas y no directamente en los tubos del [place].   Si su medicamento es muy caro, por favor, pngase en contacto con Zigmund Daniel llamando al [phone] y presione la opcin 4 o envenos un mensaje a travs de Pharmacist, community.   No podemos decirle cul ser su copago por los medicamentos por adelantado ya que  esto es diferente dependiendo de la cobertura de su seguro. Sin embargo, es posible que podamos encontrar un medicamento sustituto a Electrical engineer un formulario para que el seguro cubra el medicamento que se considera necesario.   Si se requiere una autorizacin previa para que su compaa de seguros [place] su medicamento, por favor permtanos de 1 a 2 das hbiles para completar este proceso.  Los precios de los medicamentos varan con frecuencia dependiendo del Environmental consultant de dnde se surte la receta y alguna farmacias pueden ofrecer precios ms baratos.  El sitio web www.goodrx.com tiene cupones para medicamentos de Airline pilot. Los precios aqu no tienen en cuenta lo que podra costar con la ayuda del seguro (puede ser ms barato con su seguro), pero el sitio web puede darle el precio si no utiliz Research scientist (physical sciences).  - Puede imprimir el cupn correspondiente y llevarlo con su receta a la farmacia.  - Tambin puede pasar por nuestra oficina durante el horario de atencin regular y Charity fundraiser una tarjeta de cupones de GoodRx.  - Si necesita que su receta se enve electrnicamente a una farmacia diferente, informe a nuestra oficina a travs de MyChart de [place] o por telfono llamando al [phone] y presione la opcin 4.

## 2021-11-03 NOTE — Progress Notes (Signed)
Follow-Up Visit   Subjective  Carmen [name] is a 61 y.o. female who presents for the following: Annual Exam (Skin cancer screening. Full body. HxBCC, HxSCC. Areas of concern on chest and torso) and Rash (Dur: 1 month. Right calf. Does not itch, burn, sting. Asymptomatic). Also similar on her L forearm  The patient presents for Total-Body Skin Exam (TBSE) for skin cancer screening and mole check.  The patient has spots, moles and lesions to be evaluated, some may be new or changing and the patient has concerns that these could be cancer.   The following portions of the chart were reviewed this encounter and updated as appropriate:      Review of Systems: No other skin or systemic complaints except as noted in HPI or Assessment and Plan.   Objective  Well appearing patient in no apparent distress; mood and affect are within normal limits.  A full examination was performed including scalp, head, eyes, ears, nose, lips, neck, chest, axillae, abdomen, back, buttocks, bilateral upper extremities, bilateral lower extremities, hands, feet, fingers, toes, fingernails, and toenails. All findings within normal limits unless otherwise noted below.  Right Lower Leg - Posterior, left flexor forearm Small pink-brown papules, linear configuration from R posterior ankle to medial thigh; similar smaller area on left forearm.       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 - No evidence of recurrence today - Recommend regular full body skin exams - Recommend daily broad spectrum sunscreen SPF 30+ to sun-exposed areas, reapply every 2 hours as needed.  - Call if any new or changing lesions are noted between office visits  History of Basal Cell Carcinoma of the Skin. Scalp. 2005. - No evidence of recurrence today - Recommend regular  full body skin exams - Recommend daily broad spectrum sunscreen SPF 30+ to sun-exposed areas, reapply every 2 hours as needed.  - Call if any new or changing lesions are noted between office visits   Lentigines. Sun exposed areas.  - Scattered tan macules - Due to sun exposure - Benign-appearing, observe - Recommend daily broad spectrum sunscreen SPF 30+ to sun-exposed areas, reapply every 2 hours as needed. - Call for any changes  Seborrheic Keratoses. Torso  - Stuck-on, waxy, tan-brown papules and/or plaques  - Benign-appearing - Discussed benign etiology and prognosis. - Observe - Call for any changes  Melanocytic Nevi. Torso. - Tan-brown and/or pink-flesh-colored symmetric macules and papules - Benign appearing on exam today - Observation - Call clinic for new or changing moles - Recommend daily use of broad spectrum spf 30+ sunscreen to sun-exposed areas.   Hemangiomas. Torso, chest. - Red papules - Discussed benign nature - Observe - Call for any changes  Actinic Damage - Chronic condition, secondary to cumulative UV/sun exposure - diffuse scaly erythematous macules with underlying dyspigmentation - Recommend daily broad spectrum sunscreen SPF 30+ to sun-exposed areas, reapply every 2 hours as needed.  - Staying in the shade or wearing long sleeves, sun glasses (UVA+UVB protection) and wide brim hats (4-inch brim around the entire circumference of the hat) are also recommended for sun protection.  - Call for new or changing lesions.   Dermatofibroma. Right upper back. - Firm pink/brown papulenodule with dimple sign - Benign appearing - Call for any changes  Skin cancer screening performed today.  Lichen striatus  Right Lower Leg - Posterior, left flexor forearm  Chronic inflammatory condition of skin. Unknown cause. Reassured benign. Resolves on its own over time, but can last up to couple years.  Start Triamcinolone cream twice daily as needed to affected  areas up to 2-4 weeks to see if any improvement. Avoid f/g/a. Caution skin atrophy with long-term use.   triamcinolone cream (KENALOG) 0.1 % - Right Lower Leg - Posterior, left flexor forearm Apply 1 application. topically 2 (two) times daily as needed.  Inflamed seborrheic keratosis Left Lower Back x1  Symptomatic, irritating, patient would like treated.  Destruction of lesion - Left Lower Back x1  Destruction method: cryotherapy   Informed consent: discussed and consent obtained   Lesion destroyed using liquid nitrogen: Yes   Region frozen until ice ball extended beyond lesion: Yes   Outcome: patient tolerated procedure well with no complications   Post-procedure details: wound care instructions given   Additional details:  Prior to procedure, discussed risks of blister formation, small wound, skin dyspigmentation, or rare scar following cryotherapy. Recommend Vaseline ointment to treated areas while healing.   Folliculitis axillae, upper back  Chronic condition with duration or expected duration over one year. Currently well-controlled.    Continue Clindamycin foam daily as needed.    Return in about 1 year (around 11/04/2022) for TBSE, HxBCC, HxSCC.  I, Emelia Salisbury, CMA, am acting as scribe for Brendolyn Patty, MD.  Documentation: I have reviewed the above documentation for accuracy and completeness, and I agree with the above.  Brendolyn Patty MD

## 2022-01-07 NOTE — Progress Notes (Unsigned)
61 y.o. G41P1101 Married Caucasian female here for annual exam.    PCP: Maryland Pink, MD    No LMP recorded. Patient has had a hysterectomy.           Sexually active: {yes no:314532}  The current method of family planning is status post hysterectomy.    Exercising: {yes no:314532}  {types:19826} Smoker:  no  Health Maintenance: Pap:  Hyst History of abnormal Pap:  no MMG:  06-24-21 Neg/Birads1 Colonoscopy:  09-16-20 polyp;next due 7 years BMD:  09/03/20   Result  :Normal TDaP:  06-25-21 Gardasil:   no HIV: 12-18-15 NR Hep C: 12-18-15 Neg Screening Labs:  Hb today: ***, Urine today: ***   reports that she has never smoked. She has never used smokeless tobacco. She reports that she does not drink alcohol and does not use drugs.  Past Medical History:  Diagnosis Date   Blood sugar increased 2008   Cystocele    Diabetes ([place])    Fibroid    Hx of basal cell carcinoma 2005   scalp    Hx of squamous cell carcinoma 08/02/2007   Left post thigh   Hyperlipidemia    Hypertension    Hypothyroid    Hashimoto's  thyroid   Reflux esophagitis    Urinary incontinence    Uterine prolapse    Uterine prolapse     Past Surgical History:  Procedure Laterality Date   BILATERAL SALPINGECTOMY     COLONOSCOPY WITH PROPOFOL N/A 09/16/2020   Procedure: COLONOSCOPY WITH PROPOFOL;  Surgeon: Virgel Manifold, MD;  Location: [facility] ENDOSCOPY;  Service: Endoscopy;  Laterality: N/A;   VAGINAL HYSTERECTOMY  11/08/2007   Total; Cystocele repair; valut suspension. adenomyosis fibroid    Current Outpatient Medications  Medication Sig Dispense Refill   BAYER CONTOUR NEXT TEST test strip TEST BLOOD SUGAR QD UTD  3   Calcium Carbonate (CALTRATE 600 PO) Take 600 mg by mouth daily.     Clindamycin Phosphate foam Apply once daily to affected areas 50 g 6   fexofenadine (ALLEGRA) 180 MG tablet Take 180 mg by mouth daily.     levothyroxine (SYNTHROID, LEVOTHROID) 50 MCG tablet Take 50 mcg by mouth daily  before breakfast.     losartan (COZAAR) 50 MG tablet Take 50 mg by mouth daily.     metFORMIN (GLUCOPHAGE) 500 MG tablet Take 500 mg by mouth 2 (two) times daily with a meal.     Microlet Lancets MISC 2 (two) times daily E11.9     Misc Natural Products (ESTROVEN + ENERGY MAX STRENGTH PO) Take by mouth.     Multiple Vitamins-Minerals (MULTIVITAMIN PO) Take by mouth.     PREVIDENT 5000 BOOSTER PLUS 1.1 % PSTE USE UTD QD     Red Yeast Rice Extract 600 MG CAPS Take 1 tablet by mouth daily.     triamcinolone cream (KENALOG) 0.1 % Apply 1 application. topically 2 (two) times daily as needed. 80 g 2   triamterene-hydrochlorothiazide (MAXZIDE-25) 37.5-25 MG per tablet Take 1 tablet by mouth daily.     VASCEPA 1 g capsule Take 2 g by mouth 2 (two) times daily.     No current facility-administered medications for this visit.    Family History  Problem Relation Age of Onset   Thyroid disease Mother    Osteoporosis Mother    Diabetes Father    Hypertension Father    Stroke Father    Colon cancer Other    Breast cancer Neg Hx  Review of Systems  Exam:   There were no vitals taken for this visit.    General appearance: alert, cooperative and appears stated age Head: normocephalic, without obvious abnormality, atraumatic Neck: no adenopathy, supple, symmetrical, trachea midline and thyroid normal to inspection and palpation Lungs: clear to auscultation bilaterally Breasts: normal appearance, no masses or tenderness, No nipple retraction or dimpling, No nipple discharge or bleeding, No axillary adenopathy Heart: regular rate and rhythm Abdomen: soft, non-tender; no masses, no organomegaly Extremities: extremities normal, atraumatic, no cyanosis or edema Skin: skin color, texture, turgor normal. No rashes or lesions Lymph nodes: cervical, supraclavicular, and axillary nodes normal. Neurologic: grossly normal  Pelvic: External genitalia:  no lesions              No abnormal inguinal nodes  palpated.              Urethra:  normal appearing urethra with no masses, tenderness or lesions              Bartholins and Skenes: normal                 Vagina: normal appearing vagina with normal color and discharge, no lesions              Cervix: no lesions              Pap taken: {yes no:314532} Bimanual Exam:  Uterus:  normal size, contour, position, consistency, mobility, non-tender              Adnexa: no mass, fullness, tenderness              Rectal exam: {yes no:314532}.  Confirms.              Anus:  normal sphincter tone, no lesions  Chaperone was present for exam:  ***  Assessment:   Well woman visit with gynecologic exam.   Plan: Mammogram screening discussed. Self breast awareness reviewed. Pap and HR HPV as above. Guidelines for Calcium, Vitamin D, regular exercise program including cardiovascular and weight bearing exercise.   Follow up annually and prn.   Additional counseling given.  {yes [id number]. _______ minutes face to face time of which over 50% was spent in counseling.    After visit summary provided.

## 2022-01-08 ENCOUNTER — Encounter: Payer: Self-pay | Admitting: Obstetrics and Gynecology

## 2022-01-08 ENCOUNTER — Ambulatory Visit (INDEPENDENT_AMBULATORY_CARE_PROVIDER_SITE_OTHER): Payer: Managed Care, Other (non HMO) | Admitting: Obstetrics and Gynecology

## 2022-01-08 VITALS — BP 122/76 | HR 109 | Ht 64.5 in | Wt 134.0 lb

## 2022-01-08 DIAGNOSIS — Z01419 Encounter for gynecological examination (general) (routine) without abnormal findings: Secondary | ICD-10-CM

## 2022-01-08 NOTE — Patient Instructions (Signed)

## 2022-02-02 ENCOUNTER — Other Ambulatory Visit: Payer: Self-pay

## 2022-02-02 ENCOUNTER — Emergency Department: Payer: Managed Care, Other (non HMO)

## 2022-02-02 ENCOUNTER — Encounter: Payer: Self-pay | Admitting: Intensive Care

## 2022-02-02 ENCOUNTER — Emergency Department
Admission: EM | Admit: 2022-02-02 | Discharge: 2022-02-02 | Disposition: A | Discharge status: Home / Self Care | Payer: Managed Care, Other (non HMO) | Attending: Student in an Organized Health Care Education/Training Program | Admitting: Student in an Organized Health Care Education/Training Program

## 2022-02-02 DIAGNOSIS — S300XXA Contusion of lower back and pelvis, initial encounter: Secondary | ICD-10-CM | POA: Insufficient documentation

## 2022-02-02 DIAGNOSIS — S3992XA Unspecified injury of lower back, initial encounter: Secondary | ICD-10-CM | POA: Diagnosis present

## 2022-02-02 DIAGNOSIS — Y92002 Bathroom of unspecified non-institutional (private) residence single-family (private) house as the place of occurrence of the external cause: Secondary | ICD-10-CM | POA: Insufficient documentation

## 2022-02-02 DIAGNOSIS — S301XXA Contusion of abdominal wall, initial encounter: Secondary | ICD-10-CM | POA: Diagnosis not present

## 2022-02-02 DIAGNOSIS — W182XXA Fall in (into) shower or empty bathtub, initial encounter: Secondary | ICD-10-CM | POA: Diagnosis not present

## 2022-02-02 DIAGNOSIS — T148XXA Other injury of unspecified body region, initial encounter: Secondary | ICD-10-CM

## 2022-02-02 DIAGNOSIS — W19XXXA Unspecified fall, initial encounter: Secondary | ICD-10-CM

## 2022-02-02 MED ORDER — LIDOCAINE 5 % EX PTCH: 1.0000 | MEDICATED_PATCH | Freq: Two times a day (BID) | CUTANEOUS | 0 refills | Status: DC

## 2022-02-02 NOTE — ED Triage Notes (Addendum)
Patient reports slipping in shower and hitting back and left hip area. Denies hitting head or LOC. Denies blood thinners

## 2022-02-02 NOTE — ED Provider Notes (Signed)
[facility] Provider Note    Event Date/Time   First MD Initiated Contact with Patient 02/02/22 1712     (approximate)   History   Fall   HPI  Carmen [name] is a 61 y.o. female not on any blood thinners presents to the ER for evaluation of right flank pain and low back pain that occurred after mechanical fall if she is getting into the shower after working outside today.  States that she slipped and fell right side of her flank and low back scraped against the shower threshold.  She is able to ambulate.  No LOC.  No head injury no neck pain.  Does have some right upper quadrant pain.  Has not had any hematuria.  No nausea or vomiting.     Physical Exam   Triage Vital Signs: ED Triage Vitals [02/02/22 1644]  Enc Vitals Group     BP (!) 144/76     Pulse Rate 92     Resp 16     Temp 98.2 F (36.8 C)     Temp Source Oral     SpO2 100 %     Weight 135 lb (61.2 kg)     Height '5\' 4"'$  (1.626 m)     Head Circumference      Peak Flow      Pain Score 5     Pain Loc      Pain Edu?      Excl. in [place]?     Most recent vital signs: Vitals:   02/02/22 1644  BP: (!) 144/76  Pulse: 92  Resp: 16  Temp: 98.2 F (36.8 C)  SpO2: 100%     Constitutional: Alert  Eyes: Conjunctivae are normal.  Head: Atraumatic. Nose: No congestion/rhinnorhea. Mouth/Throat: Mucous membranes are moist.   Neck: Painless ROM.  Cardiovascular:   Good peripheral circulation. Respiratory: Normal respiratory effort.  No retractions.  Gastrointestinal: Soft with mild right upper quadrant tenderness to palpation no guarding or rebound remainder of all 4 quadrants.  Contusion and abrasion on the right paralumbar and right flank area no crepitus. Musculoskeletal:  no deformity Neurologic:  MAE spontaneously. No gross focal neurologic deficits are appreciated.  Skin:  Skin is warm, dry and intact. No rash noted. Psychiatric: Mood and affect are normal. Speech and behavior are  normal.    ED Results / Procedures / Treatments   Labs (all labs ordered are listed, but only abnormal results are displayed) Labs Reviewed - No data to display   EKG     RADIOLOGY Please see ED Course for my review and interpretation.  I personally reviewed all radiographic images ordered to evaluate for the above acute complaints and reviewed radiology reports and findings.  These findings were personally discussed with the patient.  Please see medical record for radiology report.    PROCEDURES:  Critical Care performed:   Procedures   MEDICATIONS ORDERED IN ED: Medications - No data to display   IMPRESSION / MDM / [place] / ED COURSE  I reviewed the triage vital signs and the nursing notes.                              Differential diagnosis includes, but is not limited to, fracture, contusion, pneumothorax, hepatic injury, renal injury  Patient presented to the ER for evaluation of symptoms as described above.  She is well-appearing nontoxic in no acute  distress.  Exam as above.  X-rays and ultrasound ordered for the blood differential.  No head injury or neck injury to suggest neuroimaging.  Patient declining any pain medication at this time.   Clinical Course as of 02/02/22 1917  Mon Feb 02, 2022  1821 X-ray on my review and interpretation does not show any evidence of pneumothorax or fracture. [PR]  1915 Imaging is reassuring.  Discussed finding on ultrasound patient know about this likely hemangioma.  Has not progressed.  Repeat exam is reassuring.  Do believe she stable and appropriate for outpatient follow-up.  Discussed strict return precautions. Patient agreeable to plan. [PR]    Clinical Course User Index [PR] Merlyn Lot, MD     FINAL CLINICAL IMPRESSION(S) / ED DIAGNOSES   Final diagnoses:  Fall, initial encounter  Abrasion     Rx / DC Orders   ED Discharge Orders          Ordered    lidocaine (LIDODERM) 5 %  Every 12  hours        02/02/22 1914             Note:  This document was prepared using Dragon voice recognition software and may include unintentional dictation errors.    Merlyn Lot, MD 02/02/22 [phone]

## 2022-02-02 NOTE — ED Notes (Signed)
See triage note  Presents s/p fall   states she slipped and fell  Hitting her back and hip  No LOC  denies hitting her head

## 2022-05-11 ENCOUNTER — Other Ambulatory Visit: Payer: Self-pay | Admitting: Family Medicine

## 2022-05-11 DIAGNOSIS — Z1231 Encounter for screening mammogram for malignant neoplasm of breast: Secondary | ICD-10-CM

## 2022-06-26 ENCOUNTER — Ambulatory Visit
Admission: RE | Admit: 2022-06-26 | Discharge: 2022-06-26 | Disposition: A | Discharge status: Home / Self Care | Payer: Managed Care, Other (non HMO) | Source: Ambulatory Visit | Attending: Family Medicine | Admitting: Family Medicine

## 2022-06-26 DIAGNOSIS — Z1231 Encounter for screening mammogram for malignant neoplasm of breast: Secondary | ICD-10-CM

## 2022-11-09 ENCOUNTER — Ambulatory Visit: Payer: Managed Care, Other (non HMO) | Admitting: Dermatology

## 2022-11-09 VITALS — BP 117/69 | HR 86

## 2022-11-09 DIAGNOSIS — L814 Other melanin hyperpigmentation: Secondary | ICD-10-CM

## 2022-11-09 DIAGNOSIS — D489 Neoplasm of uncertain behavior, unspecified: Secondary | ICD-10-CM

## 2022-11-09 DIAGNOSIS — I8391 Asymptomatic varicose veins of right lower extremity: Secondary | ICD-10-CM

## 2022-11-09 DIAGNOSIS — Z85828 Personal history of other malignant neoplasm of skin: Secondary | ICD-10-CM

## 2022-11-09 DIAGNOSIS — L739 Follicular disorder, unspecified: Secondary | ICD-10-CM

## 2022-11-09 DIAGNOSIS — D235 Other benign neoplasm of skin of trunk: Secondary | ICD-10-CM

## 2022-11-09 DIAGNOSIS — L905 Scar conditions and fibrosis of skin: Secondary | ICD-10-CM

## 2022-11-09 DIAGNOSIS — W57XXXA Bitten or stung by nonvenomous insect and other nonvenomous arthropods, initial encounter: Secondary | ICD-10-CM

## 2022-11-09 DIAGNOSIS — D229 Melanocytic nevi, unspecified: Secondary | ICD-10-CM

## 2022-11-09 DIAGNOSIS — D239 Other benign neoplasm of skin, unspecified: Secondary | ICD-10-CM

## 2022-11-09 DIAGNOSIS — L929 Granulomatous disorder of the skin and subcutaneous tissue, unspecified: Secondary | ICD-10-CM | POA: Diagnosis not present

## 2022-11-09 DIAGNOSIS — Z1283 Encounter for screening for malignant neoplasm of skin: Secondary | ICD-10-CM

## 2022-11-09 DIAGNOSIS — X32XXXA Exposure to sunlight, initial encounter: Secondary | ICD-10-CM

## 2022-11-09 DIAGNOSIS — S20369A Insect bite (nonvenomous) of unspecified front wall of thorax, initial encounter: Secondary | ICD-10-CM

## 2022-11-09 DIAGNOSIS — L578 Other skin changes due to chronic exposure to nonionizing radiation: Secondary | ICD-10-CM

## 2022-11-09 DIAGNOSIS — W908XXA Exposure to other nonionizing radiation, initial encounter: Secondary | ICD-10-CM

## 2022-11-09 DIAGNOSIS — L821 Other seborrheic keratosis: Secondary | ICD-10-CM

## 2022-11-09 DIAGNOSIS — L720 Epidermal cyst: Secondary | ICD-10-CM

## 2022-11-09 DIAGNOSIS — D1801 Hemangioma of skin and subcutaneous tissue: Secondary | ICD-10-CM

## 2022-11-09 MED ORDER — CLINDAMYCIN PHOSPHATE 1 % EX FOAM
CUTANEOUS | 6 refills | Status: DC
Start: 2022-11-09 — End: 2023-12-07

## 2022-11-09 NOTE — Patient Instructions (Addendum)
For milia at face  Sometimes these will clear with over there counter adapalene/Differin 0.1% cream or effaclear sample given  nightly or retinol.       Biopsy Wound Care Instructions  Leave the original bandage on for 24 hours if possible.  If the bandage becomes soaked or soiled before that time, it is OK to remove it and examine the wound.  A small amount of post-operative bleeding is normal.  If excessive bleeding occurs, remove the bandage, place gauze over the site and apply continuous pressure (no peeking) over the area for 30 minutes. If this does not work, please call our clinic as soon as possible or page your doctor if it is after hours.   Once a day, cleanse the wound with soap and water. It is fine to shower. If a thick crust develops you may use a Q-tip dipped into dilute hydrogen peroxide (mix 1:1 with water) to dissolve it.  Hydrogen peroxide can slow the healing process, so use it only as needed.    After washing, apply petroleum jelly (Vaseline) or an antibiotic ointment if your doctor prescribed one for you, followed by a bandage.    For best healing, the wound should be covered with a layer of ointment at all times. If you are not able to keep the area covered with a bandage to hold the ointment in place, this may mean re-applying the ointment several times a day.  Continue this wound care until the wound has healed and is no longer open.   Itching and mild discomfort is normal during the healing process. However, if you develop pain or severe itching, please call our office.   If you have any discomfort, you can take Tylenol (acetaminophen) or ibuprofen as directed on the bottle. (Please do not take these if you have an allergy to them or cannot take them for another reason).  Some redness, tenderness and white or yellow material in the wound is normal healing.  If the area becomes very sore and red, or develops a thick yellow-green material (pus), it may be infected;  please notify us.    If you have stitches, return to clinic as directed to have the stitches removed. You will continue wound care for 2-3 days after the stitches are removed.   Wound healing continues for up to one year following surgery. It is not unusual to experience pain in the scar from time to time during the interval.  If the pain becomes severe or the scar thickens, you should notify the office.    A slight amount of redness in a scar is expected for the first six months.  After six months, the redness will fade and the scar will soften and fade.  The color difference becomes less noticeable with time.  If there are any problems, return for a post-op surgery check at your earliest convenience.  To improve the appearance of the scar, you can use silicone scar gel, cream, or sheets (such as Mederma or Serica) every night for up to one year. These are available over the counter (without a prescription).  Please call our office at [phone] for any questions or concerns.      Melanoma ABCDEs  Melanoma is the most dangerous type of skin cancer, and is the leading cause of death from skin disease.  You are more likely to develop melanoma if you: Have light-colored skin, light-colored eyes, or red or blond hair Spend a lot of time in the sun  Tan regularly, either outdoors or in a tanning bed Have had blistering sunburns, especially during childhood Have a close family member who has had a melanoma Have atypical moles or large birthmarks  Early detection of melanoma is key since treatment is typically straightforward and cure rates are extremely high if we catch it early.   The first sign of melanoma is often a change in a mole or a new dark spot.  The ABCDE system is a way of remembering the signs of melanoma.  A for asymmetry:  The two halves do not match. B for border:  The edges of the growth are irregular. C for color:  A mixture of colors are present instead of an even  brown color. D for diameter:  Melanomas are usually (but not always) greater than 6mm - the size of a pencil eraser. E for evolution:  The spot keeps changing in size, shape, and color.  Please check your skin once per month between visits. You can use a small mirror in front and a large mirror behind you to keep an eye on the back side or your body.   If you see any new or changing lesions before your next follow-up, please call to schedule a visit.  Please continue daily skin protection including broad spectrum sunscreen SPF 30+ to sun-exposed areas, reapplying every 2 hours as needed when you're outdoors.   Staying in the shade or wearing long sleeves, sun glasses (UVA+UVB protection) and wide brim hats (4-inch brim around the entire circumference of the hat) are also recommended for sun protection.     Due to recent changes in healthcare laws, you may see results of your pathology and/or laboratory studies on MyChart before the doctors have had a chance to review them. We understand that in some cases there may be results that are confusing or concerning to you. Please understand that not all results are received at the same time and often the doctors may need to interpret multiple results in order to provide you with the best plan of care or course of treatment. Therefore, we ask that you please give [place] 2 business days to thoroughly review all your results before contacting the office for clarification. Should we see a critical lab result, you will be contacted sooner.   If You Need Anything After Your Visit  If you have any questions or concerns for your doctor, please call our main line at [phone] and press option 4 to reach your doctor's medical assistant. If no one answers, please leave a voicemail as directed and we will return your call as soon as possible. Messages left after 4 pm will be answered the following business day.   You may also send [place] a message via MyChart. We  typically respond to MyChart messages within 1-2 business days.  For prescription refills, please ask your pharmacy to contact our office. Our fax number is [phone].  If you have an urgent issue when the clinic is closed that cannot wait until the next business day, you can page your doctor at the number below.    Please note that while we do our best to be available for urgent issues outside of office hours, we are not available 24/7.   If you have an urgent issue and are unable to reach [place], you may choose to seek medical care at your doctor's office, retail clinic, urgent care center, or emergency room.  If you have a medical emergency, please immediately call 911  or go to the emergency department.  Pager Numbers  - Dr. Gwen Pounds: [phone]  - Dr. Neale Burly: [phone]  - Dr. Roseanne Reno: [phone]  In the event of inclement weather, please call our main line at [phone] for an update on the status of any delays or closures.  Dermatology Medication Tips: Please keep the boxes that topical medications come in in order to help keep track of the instructions about where and how to use these. Pharmacies typically print the medication instructions only on the boxes and not directly on the medication tubes.   If your medication is too expensive, please contact our office at [phone] option 4 or send [place] a message through MyChart.   We are unable to tell what your co-pay for medications will be in advance as this is different depending on your insurance coverage. However, we may be able to find a substitute medication at lower cost or fill out paperwork to get insurance to cover a needed medication.   If a prior authorization is required to get your medication covered by your insurance company, please allow [place] 1-2 business days to complete this process.  Drug prices often vary depending on where the prescription is filled and some pharmacies may offer cheaper prices.  The  website www.goodrx.com contains coupons for medications through different pharmacies. The prices here do not account for what the cost may be with help from insurance (it may be cheaper with your insurance), but the website can give you the price if you did not use any insurance.  - You can print the associated coupon and take it with your prescription to the pharmacy.  - You may also stop by our office during regular business hours and pick up a GoodRx coupon card.  - If you need your prescription sent electronically to a different pharmacy, notify our office through [place] or by phone at [phone] option 4.     Si Usted Necesita Algo Despus de Su Visita  Tambin puede enviarnos un mensaje a travs de Clinical cytogeneticist. Por lo general respondemos a los mensajes de MyChart en el transcurso de 1 a 2 das hbiles.  Para renovar recetas, por favor pida a su farmacia que se ponga en contacto con nuestra oficina. Annie Sable de fax es [place] [phone].  Si tiene un asunto urgente cuando la clnica est cerrada y que no puede esperar hasta el siguiente da hbil, puede llamar/localizar a su doctor(a) al nmero que aparece a continuacin.   Por favor, tenga en cuenta que aunque hacemos todo lo posible para estar disponibles para asuntos urgentes fuera del horario de [place], no estamos disponibles las 24 horas del da, los 7 [address]  [address] de la [place].   Si tiene un problema urgente y no puede comunicarse con nosotros, puede optar por buscar atencin mdica  en el consultorio de su doctor(a), en una clnica privada, en un centro de atencin urgente o en una sala de emergencias.  Si tiene Engineer, drilling, por favor llame inmediatamente al 911 o vaya a la sala de emergencias.  Nmeros de bper  - Dr. Gwen Pounds: [phone]  - Dra. Moye: [phone]  - Dra. Roseanne Reno: [phone]  En caso de inclemencias del [place], por favor llame a Lacy Duverney principal al [phone] para una  actualizacin sobre el [place] de cualquier retraso o cierre.  Consejos para la medicacin en dermatologa: Por favor, guarde las cajas en las que vienen los medicamentos de uso tpico para ayudarle a seguir las instrucciones sobre dnde  y cmo usarlos. Las farmacias generalmente imprimen las instrucciones del medicamento slo en las cajas y no directamente en los tubos del [place].   Si su medicamento es muy caro, por favor, pngase en contacto con Rolm Gala llamando al [phone] y presione la opcin 4 o envenos un mensaje a travs de Clinical cytogeneticist.   No podemos decirle cul ser su copago por los medicamentos por adelantado ya que esto es diferente dependiendo de la cobertura de su seguro. Sin embargo, es posible que podamos encontrar un medicamento sustituto a Audiological scientist un formulario para que el seguro cubra el medicamento que se considera necesario.   Si se requiere una autorizacin previa para que su compaa de seguros [place] su medicamento, por favor permtanos de 1 a 2 das hbiles para completar [address]  Los precios de los medicamentos varan con frecuencia dependiendo del Environmental consultant de dnde se surte la receta y alguna farmacias pueden ofrecer precios ms baratos.  El sitio web www.goodrx.com tiene cupones para medicamentos de Health and safety inspector. Los precios aqu no tienen en cuenta lo que podra costar con la ayuda del seguro (puede ser ms barato con su seguro), pero el sitio web puede darle el precio si no utiliz Tourist information centre manager.  - Puede imprimir el cupn correspondiente y llevarlo con su receta a la farmacia.  - Tambin puede pasar por nuestra oficina durante el horario de atencin regular y Education officer, museum una tarjeta de cupones de GoodRx.  - Si necesita que su receta se enve electrnicamente a una farmacia diferente, informe a nuestra oficina a travs de MyChart de [place] o por telfono llamando al [phone] y presione la opcin 4.

## 2022-11-09 NOTE — Progress Notes (Signed)
Follow-Up Visit   Subjective  Carmen [name] is a 62 y.o. female who presents for the following: Skin Cancer Screening and Full Body Skin Exam Noticed a spot at lower right lower back 2 - 3 months ago which is bothersome.  The patient presents for Total-Body Skin Exam (TBSE) for skin cancer screening and mole check. The patient has spots, moles and lesions to be evaluated, some may be new or changing and the patient has concerns that these could be cancer.    The following portions of the chart were reviewed this encounter and updated as appropriate: medications, allergies, medical history  Review of Systems:  No other skin or systemic complaints except as noted in HPI or Assessment and Plan.  Objective  Well appearing patient in no apparent distress; mood and affect are within normal limits.  A full examination was performed including scalp, head, eyes, ears, nose, lips, neck, chest, axillae, abdomen, back, buttocks, bilateral upper extremities, bilateral lower extremities, hands, feet, fingers, toes, fingernails, and toenails. All findings within normal limits unless otherwise noted below.   Relevant physical exam findings are noted in the Assessment and Plan.  left lower sternum Pink papule  Right Lower Back 0.6 cm tan firm papule          Assessment & Plan   LENTIGINES, SEBORRHEIC KERATOSES, HEMANGIOMAS - Benign normal skin lesions - Benign-appearing - Call for any changes   MILIA Exam: tiny erythematous firm white papule periocular  Treatment Plan: Discussed this is a type of cyst. Benign-appearing. Sometimes these will clear with OTC adapalene/Differin 0.1% cream or effaclar (sample given) QHS or retinol.   MELANOCYTIC NEVI - Tan-brown and/or pink-flesh-colored symmetric macules and papules - Benign appearing on exam today - Observation - Call clinic for new or changing moles - Recommend daily use of broad spectrum spf 30+ sunscreen to sun-exposed areas.    DERMATOFIBROMA Right upper back  Exam: Firm pink/brown papulenodule with dimple sign. Treatment Plan: A dermatofibroma is a benign growth possibly related to trauma, such as an insect bite, cut from shaving, or inflamed acne-type bump.  Treatment options to remove include shave or excision with resulting scar and risk of recurrence.  Since benign-appearing and not bothersome, will observe for now.   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 Larger vessels may be covered if symptomatic and we would refer to vascular surgeon if treatment desired ([facility])  Folliculitis axillae, upper back Clear at exam    Chronic condition with duration or expected duration over one year. Currently well-controlled.    Treatment Plan:  Continue Clindamycin foam daily as needed.    ACTINIC DAMAGE - Chronic condition, secondary to cumulative UV/sun exposure - diffuse scaly erythematous macules with underlying dyspigmentation - Recommend daily broad spectrum sunscreen SPF 30+ to sun-exposed areas, reapply every 2 hours as needed.  - Staying in the shade or wearing long sleeves, sun glasses (UVA+UVB protection) and wide brim hats (4-inch brim around the entire circumference of the hat) are also recommended for sun protection.  - Call for new or changing lesions.   HISTORY OF BASAL CELL CARCINOMA OF THE SKIN - No evidence of recurrence today at scalp 2005 - Recommend regular full body skin exams -  Recommend daily broad spectrum sunscreen SPF 30+ to sun-exposed areas, reapply every 2 hours as needed.  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  - Call if any new or changing lesions are noted between office visits  SKIN CANCER SCREENING PERFORMED TODAY.   Bug bite without infection, initial encounter left lower sternum  Benign, observe.  Neoplasm of uncertain behavior Right Lower Back  Epidermal / dermal shaving  Lesion diameter (cm):  0.6 Informed consent: discussed and consent obtained   Patient was prepped and draped in usual sterile fashion: Area prepped with alcohol. Anesthesia: the lesion was anesthetized in a standard fashion   Anesthetic:  1% lidocaine w/ epinephrine 1-100,000 buffered w/ 8.4% NaHCO3 Instrument used: flexible razor blade   Hemostasis achieved with: pressure, aluminum chloride and electrodesiccation   Outcome: patient tolerated procedure well   Post-procedure details: wound care instructions given   Post-procedure details comment:  Ointment and small bandage applied.   Specimen 1 - Surgical pathology Differential Diagnosis: irritated dermatofibroma vs cyst   Check Margins: No  Irritated Dermatofibroma vs cyst   Folliculitis  Related Medications Clindamycin Phosphate foam Apply once daily to affected areas   Return in about 1 year (around 11/09/2023) for TBSE.  I, Asher Muir, CMA, am acting as scribe for Willeen Niece, MD.   Documentation: I have reviewed the above documentation for accuracy and completeness, and I agree with the above.  Willeen Niece, MD

## 2022-11-17 ENCOUNTER — Telehealth: Payer: Self-pay

## 2022-11-17 NOTE — Telephone Encounter (Signed)
-----   Message from Willeen Niece, MD sent at 11/16/2022  7:26 PM EDT ----- Skin , right lower back SUPERFICIAL SCAR AND KERATIN GRANULOMA  Consistent with a ruptured cyst, benign- please call patient

## 2022-11-17 NOTE — Telephone Encounter (Signed)
Advised patient biopsy was benign ruptured cyst, no further treatment needed.

## 2022-12-28 NOTE — Progress Notes (Signed)
62 y.o. [id number] Married Caucasian female here for annual exam.    Some urgency to empty her bladder.  Takes a diuretic.  No urinary incontinence.   Husband with health issues.   PCP:  Jerl Mina   No LMP recorded. Patient has had a hysterectomy.           Sexually active: Yes.    The current method of family planning is status post hysterectomy.    Exercising: Yes.     walking Smoker:  no  Health Maintenance: Pap:  hyst History of abnormal Pap:  no MMG:  06/26/22 Breast Density Cat B, BI-RADS CAT 1 neg Colonoscopy:  09/16/20 polyp - due in 2029.  BMD:   09/03/20  Result  WNL TDaP:  06/25/21 Gardasil:   no HIV: 12/18/15 NR Hep C: 12/18/15 Neg Screening Labs:  PCP   reports that she has never smoked. She has never used smokeless tobacco. She reports that she does not drink alcohol and does not use drugs.  Past Medical History:  Diagnosis Date   Blood sugar increased 2008   Cystocele    Diabetes (HCC)    Fibroid    Hx of basal cell carcinoma 2005   scalp    Hx of squamous cell carcinoma 08/02/2007   Left post thigh   Hyperlipidemia    Hypertension    Hypothyroid    Hashimoto's  thyroid   Reflux esophagitis    Urinary incontinence    Uterine prolapse    Uterine prolapse     Past Surgical History:  Procedure Laterality Date   BILATERAL SALPINGECTOMY     COLONOSCOPY WITH PROPOFOL N/A 09/16/2020   Procedure: COLONOSCOPY WITH PROPOFOL;  Surgeon: Pasty Spillers, MD;  Location: [facility] ENDOSCOPY;  Service: Endoscopy;  Laterality: N/A;   VAGINAL HYSTERECTOMY  11/08/2007   Total; Cystocele repair; valut suspension. adenomyosis fibroid    Current Outpatient Medications  Medication Sig Dispense Refill   BAYER CONTOUR NEXT TEST test strip TEST BLOOD SUGAR QD UTD  3   Calcium Carbonate (CALTRATE 600 PO) Take 600 mg by mouth daily.     Clindamycin Phosphate foam Apply once daily to affected areas 50 g 6   fexofenadine (ALLEGRA) 180 MG tablet Take 180 mg by mouth daily.      levothyroxine (SYNTHROID, LEVOTHROID) 50 MCG tablet Take 50 mcg by mouth daily before breakfast.     losartan (COZAAR) 50 MG tablet Take 50 mg by mouth daily.     metFORMIN (GLUCOPHAGE) 500 MG tablet Take 500 mg by mouth 2 (two) times daily with a meal.     Microlet Lancets MISC 2 (two) times daily E11.9     Misc Natural Products (ESTROVEN + ENERGY MAX STRENGTH PO) Take by mouth.     Multiple Vitamins-Minerals (MULTIVITAMIN PO) Take by mouth.     PREVIDENT 5000 BOOSTER PLUS 1.1 % PSTE USE UTD QD     triamterene-hydrochlorothiazide (MAXZIDE-25) 37.5-25 MG per tablet Take 1 tablet by mouth daily.     VASCEPA 1 g capsule Take 2 g by mouth 2 (two) times daily.     pravastatin (PRAVACHOL) 10 MG tablet Take 1 tablet by mouth 3 (three) times a week.     triamcinolone cream (KENALOG) 0.1 % Apply 1 application. topically 2 (two) times daily as needed. (Patient not taking: Reported on 01/11/2023) 80 g 2   No current facility-administered medications for this visit.    Family History  Problem Relation Age of Onset   Thyroid  disease Mother    Osteoporosis Mother    Diabetes Father    Hypertension Father    Stroke Father    Colon cancer Other    Breast cancer Neg Hx     Review of Systems  Constitutional: Negative.   HENT: Negative.    Eyes: Negative.   Respiratory: Negative.    Cardiovascular: Negative.   Gastrointestinal: Negative.   Endocrine: Negative.   Genitourinary: Negative.   Musculoskeletal: Negative.   Skin: Negative.   Allergic/Immunologic: Negative.   Neurological: Negative.   Hematological: Negative.   Psychiatric/Behavioral: Negative.      Exam:   BP 110/70   Pulse 82   Ht 5' 3.75" (1.619 m)   Wt 128 lb (58.1 kg)   SpO2 99%   BMI 22.14 kg/m     General appearance: alert, cooperative and appears stated age Head: normocephalic, without obvious abnormality, atraumatic Neck: no adenopathy, supple, symmetrical, trachea midline and thyroid normal to inspection and  palpation Lungs: clear to auscultation bilaterally Breasts: normal appearance, no masses or tenderness, No nipple retraction or dimpling, No nipple discharge or bleeding, No axillary adenopathy Heart: regular rate and rhythm Abdomen: soft, non-tender; no masses, no organomegaly Extremities: extremities normal, atraumatic, no cyanosis or edema Skin: skin color, texture, turgor normal. No rashes or lesions Lymph nodes: cervical, supraclavicular, and axillary nodes normal. Neurologic: grossly normal  Pelvic: External genitalia:  no lesions              No abnormal inguinal nodes palpated.              Urethra:  normal appearing urethra with no masses, tenderness or lesions              Bartholins and Skenes: normal                 Vagina: normal appearing vagina with normal color and discharge, no lesions.  First degree cystocele with valsalva.  Good apical and posterior vaginal support.  Excellent Kegel.              Cervix: absent              Pap taken: no Bimanual Exam:  Uterus:  absent              Adnexa: no mass, fullness, tenderness              Rectal exam: yes.  Confirms.              Anus:  normal sphincter tone, no lesions  Chaperone was present for exam:  declined  Assessment:   Well woman visit with gynecologic exam. Status post TVH and vault suspension, 2009. Ovaries remain. First degree cystocele. Vaginal atrophy.   Plan: Mammogram screening discussed. Self breast awareness reviewed. Pap and HR HPV  not indicated.  Guidelines for Calcium, Vitamin D, regular exercise program including cardiovascular and weight bearing exercise. We discussed vaginal lubricants.  She will try liquid vit E product. Follow up annually and prn.

## 2023-01-11 ENCOUNTER — Encounter: Payer: Self-pay | Admitting: Obstetrics and Gynecology

## 2023-01-11 ENCOUNTER — Ambulatory Visit (INDEPENDENT_AMBULATORY_CARE_PROVIDER_SITE_OTHER): Payer: Managed Care, Other (non HMO) | Admitting: Obstetrics and Gynecology

## 2023-01-11 VITALS — BP 110/70 | HR 82 | Ht 63.75 in | Wt 128.0 lb

## 2023-01-11 DIAGNOSIS — Z01419 Encounter for gynecological examination (general) (routine) without abnormal findings: Secondary | ICD-10-CM

## 2023-01-11 NOTE — Patient Instructions (Signed)

## 2023-05-13 ENCOUNTER — Other Ambulatory Visit: Payer: Self-pay | Admitting: Family Medicine

## 2023-05-13 DIAGNOSIS — Z1231 Encounter for screening mammogram for malignant neoplasm of breast: Secondary | ICD-10-CM

## 2023-06-28 ENCOUNTER — Ambulatory Visit
Admission: RE | Admit: 2023-06-28 | Discharge: 2023-06-28 | Disposition: A | Discharge status: Home / Self Care | Payer: Managed Care, Other (non HMO) | Source: Ambulatory Visit | Attending: Family Medicine | Admitting: Family Medicine

## 2023-06-28 DIAGNOSIS — Z1231 Encounter for screening mammogram for malignant neoplasm of breast: Secondary | ICD-10-CM | POA: Diagnosis present

## 2023-12-07 ENCOUNTER — Ambulatory Visit (INDEPENDENT_AMBULATORY_CARE_PROVIDER_SITE_OTHER): Payer: Managed Care, Other (non HMO) | Admitting: Dermatology

## 2023-12-07 DIAGNOSIS — L578 Other skin changes due to chronic exposure to nonionizing radiation: Secondary | ICD-10-CM | POA: Diagnosis not present

## 2023-12-07 DIAGNOSIS — L821 Other seborrheic keratosis: Secondary | ICD-10-CM

## 2023-12-07 DIAGNOSIS — L700 Acne vulgaris: Secondary | ICD-10-CM

## 2023-12-07 DIAGNOSIS — W908XXA Exposure to other nonionizing radiation, initial encounter: Secondary | ICD-10-CM | POA: Diagnosis not present

## 2023-12-07 DIAGNOSIS — L814 Other melanin hyperpigmentation: Secondary | ICD-10-CM

## 2023-12-07 DIAGNOSIS — Z85828 Personal history of other malignant neoplasm of skin: Secondary | ICD-10-CM

## 2023-12-07 DIAGNOSIS — D1801 Hemangioma of skin and subcutaneous tissue: Secondary | ICD-10-CM

## 2023-12-07 DIAGNOSIS — L820 Inflamed seborrheic keratosis: Secondary | ICD-10-CM | POA: Diagnosis not present

## 2023-12-07 DIAGNOSIS — Z1283 Encounter for screening for malignant neoplasm of skin: Secondary | ICD-10-CM | POA: Diagnosis not present

## 2023-12-07 DIAGNOSIS — L739 Follicular disorder, unspecified: Secondary | ICD-10-CM | POA: Diagnosis not present

## 2023-12-07 DIAGNOSIS — L720 Epidermal cyst: Secondary | ICD-10-CM

## 2023-12-07 DIAGNOSIS — D235 Other benign neoplasm of skin of trunk: Secondary | ICD-10-CM

## 2023-12-07 DIAGNOSIS — D239 Other benign neoplasm of skin, unspecified: Secondary | ICD-10-CM

## 2023-12-07 DIAGNOSIS — D229 Melanocytic nevi, unspecified: Secondary | ICD-10-CM

## 2023-12-07 MED ORDER — CLINDAMYCIN PHOSPHATE 1 % EX FOAM: CUTANEOUS | 6 refills | Status: AC

## 2023-12-07 MED ORDER — TRETINOIN 0.05 % EX CREA: TOPICAL_CREAM | Freq: Every day | CUTANEOUS | 5 refills | Status: AC

## 2023-12-07 NOTE — Patient Instructions (Addendum)
 - Will send in tretinoin  0.05% cr starting every other night and increase to nightly as tolerated Can use sandwich method with moisturizer - apply moisturizer, then medication, then moisturizer.  Topical retinoid medications like tretinoin /Retin-A , adapalene/Differin, tazarotene/Fabior, and Epiduo/Epiduo Forte can cause dryness and irritation when first started. Only apply a pea-sized amount to the entire affected area. Avoid applying it around the eyes, edges of mouth and creases at the nose. If you experience irritation, use a good moisturizer first and/or apply the medicine less often. If you are doing well with the medicine, you can increase how often you use it until you are applying every night. Be careful with sun protection while using this medication as it can make you sensitive to the sun. This medicine should not be used by pregnant women.   Melanoma ABCDEs  Melanoma is the most dangerous type of skin cancer, and is the leading cause of death from skin disease.  You are more likely to develop melanoma if you: Have light-colored skin, light-colored eyes, or red or blond hair Spend a lot of time in the sun Tan regularly, either outdoors or in a tanning bed Have had blistering sunburns, especially during childhood Have a close family member who has had a melanoma Have atypical moles or large birthmarks  Early detection of melanoma is key since treatment is typically straightforward and cure rates are extremely high if we catch it early.   The first sign of melanoma is often a change in a mole or a new dark spot.  The ABCDE system is a way of remembering the signs of melanoma.  A for asymmetry:  The two halves do not match. B for border:  The edges of the growth are irregular. C for color:  A mixture of colors are present instead of an even brown color. D for diameter:  Melanomas are usually (but not always) greater than 6mm - the size of a pencil eraser. E for evolution:  The spot  keeps changing in size, shape, and color.  Please check your skin once per month between visits. You can use a small mirror in front and a large mirror behind you to keep an eye on the back side or your body.   If you see any new or changing lesions before your next follow-up, please call to schedule a visit.  Please continue daily skin protection including broad spectrum sunscreen SPF 30+ to sun-exposed areas, reapplying every 2 hours as needed when you're outdoors.    Due to recent changes in healthcare laws, you may see results of your pathology and/or laboratory studies on MyChart before the doctors have had a chance to review them. We understand that in some cases there may be results that are confusing or concerning to you. Please understand that not all results are received at the same time and often the doctors may need to interpret multiple results in order to provide you with the best plan of care or course of treatment. Therefore, we ask that you please give us  2 business days to thoroughly review all your results before contacting the office for clarification. Should we see a critical lab result, you will be contacted sooner.   If You Need Anything After Your Visit  If you have any questions or concerns for your doctor, please call our main line at [phone] and press option 4 to reach your doctor's medical assistant. If no one answers, please leave a voicemail as directed and we will return your  call as soon as possible. Messages left after 4 pm will be answered the following business day.   You may also send us  a message via MyChart. We typically respond to MyChart messages within 1-2 business days.  For prescription refills, please ask your pharmacy to contact our office. Our fax number is [phone].  If you have an urgent issue when the clinic is closed that cannot wait until the next business day, you can page your doctor at the number below.    Please note that while we  do our best to be available for urgent issues outside of office hours, we are not available 24/7.   If you have an urgent issue and are unable to reach us , you may choose to seek medical care at your doctor's office, retail clinic, urgent care center, or emergency room.  If you have a medical emergency, please immediately call 911 or go to the emergency department.  Pager Numbers  - Dr. Hester: [phone]  - Dr. Jackquline: [phone]  - Dr. Claudene: [phone]   In the event of inclement weather, please call our main line at [phone] for an update on the status of any delays or closures.  Dermatology Medication Tips: Please keep the boxes that topical medications come in in order to help keep track of the instructions about where and how to use these. Pharmacies typically print the medication instructions only on the boxes and not directly on the medication tubes.   If your medication is too expensive, please contact our office at [phone] option 4 or send us  a message through MyChart.   We are unable to tell what your co-pay for medications will be in advance as this is different depending on your insurance coverage. However, we may be able to find a substitute medication at lower cost or fill out paperwork to get insurance to cover a needed medication.   If a prior authorization is required to get your medication covered by your insurance company, please allow us  1-2 business days to complete this process.  Drug prices often vary depending on where the prescription is filled and some pharmacies may offer cheaper prices.  The website www.goodrx.com contains coupons for medications through different pharmacies. The prices here do not account for what the cost may be with help from insurance (it may be cheaper with your insurance), but the website can give you the price if you did not use any insurance.  - You can print the associated coupon and take it with your prescription  to the pharmacy.  - You may also stop by our office during regular business hours and pick up a GoodRx coupon card.  - If you need your prescription sent electronically to a different pharmacy, notify our office through [facility] or by phone at [phone] option 4.     Si Usted Necesita Algo Despus de Su Visita  Tambin puede enviarnos un mensaje a travs de Clinical cytogeneticist. Por lo general respondemos a los mensajes de MyChart en el transcurso de 1 a 2 das hbiles.  Para renovar recetas, por favor pida a su farmacia que se ponga en contacto con nuestra oficina. Randi lakes de fax es [place] [phone].  Si tiene un asunto urgente cuando la clnica est cerrada y que no puede esperar hasta el siguiente da hbil, puede llamar/localizar a su doctor(a) al nmero que aparece a continuacin.   Por favor, tenga en cuenta que aunque hacemos todo lo posible para estar disponibles para asuntos urgentes  fuera del horario de oficina, no estamos disponibles las 24 horas del da, los 7 [address]  [address] de la [place].   Si tiene un problema urgente y no puede comunicarse con nosotros, puede optar por buscar atencin mdica  en el consultorio de su doctor(a), en una clnica privada, en un centro de atencin urgente o en una sala de emergencias.  Si tiene Engineer, drilling, por favor llame inmediatamente al 911 o vaya a la sala de emergencias.  Nmeros de bper  - Dr. Hester: [phone]  - Dra. Jackquline: [phone]  - Dr. Claudene: [phone]   En caso de inclemencias del tiempo, por favor llame a landry capes principal al [phone] para una actualizacin sobre el [place] de cualquier retraso o cierre.  Consejos para la medicacin en dermatologa: Por favor, guarde las cajas en las que vienen los medicamentos de uso tpico para ayudarle a seguir las instrucciones sobre dnde y cmo usarlos. Las farmacias generalmente imprimen las instrucciones del medicamento slo en las cajas y no directamente  en los tubos del [place].   Si su medicamento es muy caro, por favor, pngase en contacto con landry rieger llamando al [phone] y presione la opcin 4 o envenos un mensaje a travs de Clinical cytogeneticist.   No podemos decirle cul ser su copago por los medicamentos por adelantado ya que esto es diferente dependiendo de la cobertura de su seguro. Sin embargo, es posible que podamos encontrar un medicamento sustituto a Audiological scientist un formulario para que el seguro cubra el medicamento que se considera necesario.   Si se requiere una autorizacin previa para que su compaa de seguros malta su medicamento, por favor permtanos de 1 a 2 das hbiles para completar este proceso.  Los precios de los medicamentos varan con frecuencia dependiendo del Environmental consultant de dnde se surte la receta y alguna farmacias pueden ofrecer precios ms baratos.  El sitio web www.goodrx.com tiene cupones para medicamentos de Health and safety inspector. Los precios aqu no tienen en cuenta lo que podra costar con la ayuda del seguro (puede ser ms barato con su seguro), pero el sitio web puede darle el precio si no utiliz Tourist information centre manager.  - Puede imprimir el cupn correspondiente y llevarlo con su receta a la farmacia.  - Tambin puede pasar por nuestra oficina durante el horario de atencin regular y Education officer, museum una tarjeta de cupones de GoodRx.  - Si necesita que su receta se enve electrnicamente a una farmacia diferente, informe a nuestra oficina a travs de MyChart de [place] o por telfono llamando al [phone] y presione la opcin 4.

## 2023-12-07 NOTE — Progress Notes (Signed)
 Follow-Up Visit   Subjective  Carmen [name] is a 63 y.o. female who presents for the following: Skin Cancer Screening and Full Body Skin Exam  The patient presents for Total-Body Skin Exam (TBSE) for skin cancer screening and mole check. The patient has spots, moles and lesions to be evaluated, some may be new or changing and the patient may have concern these could be cancer.  Hx SCC, BCC. Hard brown spot near right ear, patient just noticed a few weeks ago. Also other scalp spots on face that she picks at and are bothersome. Patient with hx of folliculitis at axilla, uses clindamycin  foam as needed, needs refills.   The following portions of the chart were reviewed this encounter and updated as appropriate: medications, allergies, medical history  Review of Systems:  No other skin or systemic complaints except as noted in HPI or Assessment and Plan.  Objective  Well appearing patient in no apparent distress; mood and affect are within normal limits.  A full examination was performed including scalp, head, eyes, ears, nose, lips, neck, chest, axillae, abdomen, back, buttocks, bilateral upper extremities, bilateral lower extremities, hands, feet, fingers, toes, fingernails, and toenails. All findings within normal limits unless otherwise noted below.   Relevant physical exam findings are noted in the Assessment and Plan.  R temporal hairline x 1, R zygoma x 1, L preauricular x 1, L forearm x 2 (5) Erythematous stuck-on, waxy papule  Assessment & Plan   SKIN CANCER SCREENING PERFORMED TODAY.  ACTINIC DAMAGE - Chronic condition, secondary to cumulative UV/sun exposure - diffuse scaly erythematous macules with underlying dyspigmentation - Recommend daily broad spectrum sunscreen SPF 30+ to sun-exposed areas, reapply every 2 hours as needed.  - Staying in the shade or wearing long sleeves, sun glasses (UVA+UVB protection) and wide brim hats (4-inch brim around the entire circumference  of the hat) are also recommended for sun protection.  - Call for new or changing lesions.  LENTIGINES, SEBORRHEIC KERATOSES, HEMANGIOMAS - Benign normal skin lesions - Benign-appearing - Call for any changes  MELANOCYTIC NEVI - Tan-brown and/or pink-flesh-colored symmetric macules and papules - Benign appearing on exam today - Observation - Call clinic for new or changing moles - Recommend daily use of broad spectrum spf 30+ sunscreen to sun-exposed areas.   HISTORY OF BASAL CELL CARCINOMA OF THE SKIN - scalp 2005 - No evidence of recurrence today - Recommend regular full body skin exams - Recommend daily broad spectrum sunscreen SPF 30+ to sun-exposed areas, reapply every 2 hours as needed.  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  - Call if any new or changing lesions are noted between office visits  DERMATOFIBROMA vs SCAR Right upper back, right spinal lower back Exam: Firm pink/white papulenodule with dimple sign 0.6 cm. Treatment Plan: A dermatofibroma is a benign growth possibly related to trauma, such as an insect bite, cut from shaving, or inflamed acne-type bump.  Treatment options to remove include shave or excision with resulting scar and risk of recurrence.  Since benign-appearing and not bothersome, will observe for now.   Folliculitis Exam: Pink follicular papules at axillae   Chronic and persistent condition with duration or expected duration over  one year. Condition is improving with treatment but not currently at goal.   Folliculitis occurs due to inflammation of the superficial hair follicle (pore), resulting in acne-like lesions (pus bumps). It can be infectious (bacterial,  fungal) or noninfectious (shaving, tight clothing, heat/sweat, medications).  Folliculitis can be acute or chronic and recommended treatment depends on the underlying cause of folliculitis.    Treatment Plan:  Continue Clindamycin  foam daily as needed.  Continue sensitive skin deoderant  Milia/Comedonal Acne - tiny firm white papules, closed comedones at face - benign - sometimes these will clear with nightly OTC adapalene/Differin 0.1% gel or retinol. - may be extracted if symptomatic - observe - Continue tretinoin  0.05% cr starting every other night and increase to nightly as tolerated Can use sandwich method with moisturizer - apply moisturizer, then medication, then moisturizer.  Topical retinoid medications like tretinoin /Retin-A , adapalene/Differin, tazarotene/Fabior, and Epiduo/Epiduo Forte can cause dryness and irritation when first started. Only apply a pea-sized amount to the entire affected area. Avoid applying it around the eyes, edges of mouth and creases at the nose. If you experience irritation, use a good moisturizer first and/or apply the medicine less often. If you are doing well with the medicine, you can increase how often you use it until you are applying every night. Be careful with sun protection while using this medication as it can make you sensitive to the sun. This medicine should not be used by pregnant women.     INFLAMED SEBORRHEIC KERATOSIS (5) R temporal hairline x 1, R zygoma x 1, L preauricular x 1, L forearm x 2 (5) Vs AKs at left forearm  Symptomatic, irritating, patient would like treated.  Benign-appearing.  Call clinic for new or changing lesions.     Destruction of lesion - R temporal hairline x 1, R zygoma x 1, L preauricular x 1, L forearm x 2 (5)  Destruction method: cryotherapy   Informed consent: discussed and consent obtained   Lesion destroyed using liquid nitrogen: Yes   Region frozen until ice ball extended beyond lesion: Yes    Outcome: patient tolerated procedure well with no complications   Post-procedure details: wound care instructions given   Additional details:  Prior to procedure, discussed risks of blister formation, small wound, skin dyspigmentation, or rare scar following cryotherapy. Recommend Vaseline ointment to treated areas while healing.   ACNE VULGARIS   Related Medications tretinoin  (RETIN-A ) 0.05 % cream Apply topically at bedtime. Start with pea size amount every other night and increase to nightly as tolerated. FOLLICULITIS   Related Medications Clindamycin  Phosphate foam Apply once daily to affected areas Return in about 1 year (around 12/06/2024) for TBSE, with Dr. Jackquline, HxBCC, HxSCC, Folliculitis.  LILLETTE Lonell Drones, RMA, am acting as scribe for Rexene Jackquline, MD .   Documentation: I have reviewed the above documentation for accuracy and completeness, and I agree with the above.  Rexene Jackquline, MD

## 2024-01-12 NOTE — Progress Notes (Signed)
 63 y.o. G82P1101 Married Caucasian female here for annual exam.    Sometimes has urgency to void.  Occasional urinary incontinence with position changes.  No leak with laugh, sneeze, or cough.  Up once a night to void.   Does Kegel's.  Some dryness with intercourse.    Last A1C 6.1.   Dealing with significant reflux, which is improving.   She will see GI.   PCP: Valora Agent, MD  Endocrinology:  Dr. Chip - [facility]  No LMP recorded. Patient has had a hysterectomy.           Sexually active: Yes.    The current method of family planning is status post hysterectomy.    Menopausal hormone therapy:  n/a Exercising: Yes.    Walking Smoker:  no  OB History  Gravida Para Term Preterm AB Living  2 2 1 1  0 1  SAB IAB Ectopic Multiple Live Births      1    # Outcome Date GA Lbr Len/2nd Weight Sex Type Anes PTL Lv  2 Preterm           1 Term      Vag-Spont   LIV    Obstetric Comments  1st Menstrual Cycle: 10  1st Pregnancy:  29     HEALTH MAINTENANCE: Last 2 paps:  2014 normal  History of abnormal Pap or positive HPV:  no Mammogram:   06/28/23 Breast Density Cat B, BIRADS Cat 1 neg  Colonoscopy:  09/16/20 - polyp - due in 2029 Bone Density:  03/29/13  Result  normal    Immunization History  Administered Date(s) Administered   Influenza-Unspecified 02/27/2014   PFIZER Comirnaty(Gray Top)Covid-19 Tri-Sucrose Vaccine 08/22/2019, 09/12/2019   PFIZER(Purple Top)SARS-COV-2 Vaccination 08/22/2019, 09/12/2019   PNEUMOCOCCAL CONJUGATE-20 09/04/2021   Tdap 06/01/2001, 06/25/2021   Zoster Recombinant(Shingrix) 11/01/2020      reports that she has never smoked. She has never used smokeless tobacco. She reports that she does not drink alcohol and does not use drugs.  Past Medical History:  Diagnosis Date   Blood sugar increased 2008   Cystocele    Diabetes (HCC)    Fibroid    Hx of basal cell carcinoma 2005   scalp    Hx of squamous cell carcinoma 08/02/2007   Left  post thigh   Hyperlipidemia    Hypertension    Hypothyroid    Hashimoto's  thyroid    Reflux esophagitis    Urinary incontinence    Uterine prolapse    Uterine prolapse     Past Surgical History:  Procedure Laterality Date   BILATERAL SALPINGECTOMY     COLONOSCOPY WITH PROPOFOL  N/A 09/16/2020   Procedure: COLONOSCOPY WITH PROPOFOL ;  Surgeon: Janalyn Keene NOVAK, MD;  Location: [facility] ENDOSCOPY;  Service: Endoscopy;  Laterality: N/A;   VAGINAL HYSTERECTOMY  11/08/2007   Total; Cystocele repair; valut suspension. adenomyosis fibroid    Current Outpatient Medications  Medication Sig Dispense Refill   BAYER CONTOUR NEXT TEST test strip TEST BLOOD SUGAR QD UTD  3   Calcium Carbonate (CALTRATE 600 PO) Take 600 mg by mouth daily.     Clindamycin  Phosphate foam Apply once daily to affected areas 50 g 6   fexofenadine (ALLEGRA) 180 MG tablet Take 180 mg by mouth daily.     levothyroxine (SYNTHROID, LEVOTHROID) 50 MCG tablet Take 50 mcg by mouth daily before breakfast.     losartan (COZAAR) 50 MG tablet Take 50 mg by mouth daily.  metFORMIN (GLUCOPHAGE) 500 MG tablet Take 500 mg by mouth 2 (two) times daily with a meal.     Microlet Lancets MISC 2 (two) times daily E11.9     Multiple Vitamins-Minerals (MULTIVITAMIN PO) Take by mouth.     omeprazole (PRILOSEC) 40 MG capsule Take 40 mg by mouth.     pravastatin (PRAVACHOL) 10 MG tablet Take 1 tablet by mouth 3 (three) times a week.     PREVIDENT 5000 BOOSTER PLUS 1.1 % PSTE USE UTD QD     tretinoin  (RETIN-A ) 0.05 % cream Apply topically at bedtime. Start with pea size amount every other night and increase to nightly as tolerated. 45 g 5   triamterene-hydrochlorothiazide (MAXZIDE-25) 37.5-25 MG per tablet Take 1 tablet by mouth daily.     VASCEPA 1 g capsule Take 2 g by mouth 2 (two) times daily.     Misc Natural Products (ESTROVEN + ENERGY MAX STRENGTH PO) Take by mouth. (Patient not taking: Reported on 01/13/2024)     sucralfate (CARAFATE)  1 GM/10ML suspension Take 1 g by mouth. (Patient not taking: Reported on 01/13/2024)     triamcinolone  cream (KENALOG ) 0.1 % Apply 1 application. topically 2 (two) times daily as needed. (Patient not taking: Reported on 01/13/2024) 80 g 2   No current facility-administered medications for this visit.    ALLERGIES: Atenolol, Latex, and Rosuvastatin  Family History  Problem Relation Age of Onset   Thyroid  disease Mother    Osteoporosis Mother    Diabetes Father    Hypertension Father    Stroke Father    Colon cancer Other    Breast cancer Neg Hx     Review of Systems  All other systems reviewed and are negative.   PHYSICAL EXAM:  BP 126/82 (BP Location: Left Arm, Patient Position: Sitting)   Pulse 64   Ht 5' 6 (1.676 m)   Wt 133 lb (60.3 kg)   SpO2 98%   BMI 21.47 kg/m     General appearance: alert, cooperative and appears stated age Head: normocephalic, without obvious abnormality, atraumatic Neck: no adenopathy, supple, symmetrical, trachea midline and thyroid  normal to inspection and palpation Lungs: clear to auscultation bilaterally Breasts: normal appearance, no masses or tenderness, No nipple retraction or dimpling, No nipple discharge or bleeding, No axillary adenopathy Heart: regular rate and rhythm Abdomen: soft, non-tender; no masses, no organomegaly Extremities: extremities normal, atraumatic, no cyanosis or edema Skin: skin color, texture, turgor normal. No rashes or lesions Lymph nodes: cervical, supraclavicular, and axillary nodes normal. Neurologic: grossly normal  Pelvic: External genitalia:  no lesions              No abnormal inguinal nodes palpated.              Urethra:  normal appearing urethra with no masses, tenderness or lesions              Bartholins and Skenes: normal                 Vagina: normal appearing vagina with normal color and discharge, no lesions              Cervix: absent              Pap taken: no Bimanual Exam:  Uterus:   absent.  Excellent Kegel.              Adnexa: no mass, fullness, tenderness  Rectal exam: yes.  Confirms.              Anus:  normal sphincter tone, no lesions  Chaperone was present for exam:  Kari HERO, CMA  ASSESSMENT: Well woman visit with gynecologic exam. Status post TVH and vault suspension, 2009. Ovaries remain. First degree cystocele.  Stable. Mild stress incontinence. Vaginal atrophy.  PHQ-2-9: 0 DM.  Reflux.  PLAN: Mammogram screening discussed. Self breast awareness reviewed. Pap and HRV collected:  no.  Not indicated.  Guidelines for Calcium, Vitamin D, regular exercise program including cardiovascular and weight bearing exercise. Kegels' reviewed.  Patient currently declines pelvic floor PT. Lubricants and cooking oils discussed for vaginal hydration.  Medication refills:  NA Labs with PCP and endocrinology.  Follow up:  yearly and prn.

## 2024-01-13 ENCOUNTER — Encounter: Payer: Self-pay | Admitting: Obstetrics and Gynecology

## 2024-01-13 ENCOUNTER — Ambulatory Visit (INDEPENDENT_AMBULATORY_CARE_PROVIDER_SITE_OTHER): Payer: Managed Care, Other (non HMO) | Admitting: Obstetrics and Gynecology

## 2024-01-13 VITALS — BP 126/82 | HR 64 | Ht 66.0 in | Wt 133.0 lb

## 2024-01-13 DIAGNOSIS — N8110 Cystocele, unspecified: Secondary | ICD-10-CM

## 2024-01-13 DIAGNOSIS — Z01419 Encounter for gynecological examination (general) (routine) without abnormal findings: Secondary | ICD-10-CM | POA: Diagnosis not present

## 2024-01-13 DIAGNOSIS — N393 Stress incontinence (female) (male): Secondary | ICD-10-CM | POA: Diagnosis not present

## 2024-01-13 DIAGNOSIS — Z1331 Encounter for screening for depression: Secondary | ICD-10-CM | POA: Diagnosis not present

## 2024-01-13 NOTE — Patient Instructions (Addendum)
 Kegel Exercises  Kegel exercises can help strengthen your pelvic floor muscles. The pelvic floor is a group of muscles that support your rectum, small intestine, and bladder. In females, pelvic floor muscles also help support the uterus. These muscles help you control the flow of urine and stool (feces). Kegel exercises are painless and simple. They do not require any equipment. Your provider may suggest Kegel exercises to: Improve bladder and bowel control. Improve sexual response. Improve weak pelvic floor muscles after surgery to remove the uterus (hysterectomy) or after pregnancy, in females. Improve weak pelvic floor muscles after prostate gland removal or surgery, in males. Kegel exercises involve squeezing your pelvic floor muscles. These are the same muscles you squeeze when you try to stop the flow of urine or keep from passing gas. The exercises can be done while sitting, standing, or lying down, but it is best to vary your position. Ask your health care provider which exercises are safe for you. Do exercises exactly as told by your health care provider and adjust them as directed. Do not begin these exercises until told by your health care provider. Exercises How to do Kegel exercises: Squeeze your pelvic floor muscles tight. You should feel a tight lift in your rectal area. If you are a female, you should also feel a tightness in your vaginal area. Keep your stomach, buttocks, and legs relaxed. Hold the muscles tight for up to 10 seconds. Breathe normally. Relax your muscles for up to 10 seconds. Repeat as told by your health care provider. Repeat this exercise daily as told by your health care provider. Continue to do this exercise for at least 4-6 weeks, or for as long as told by your health care provider. You may be referred to a physical therapist who can help you learn more about how to do Kegel exercises. Depending on your condition, your health care provider may  recommend: Varying how long you squeeze your muscles. Doing several sets of exercises every day. Doing exercises for several weeks. Making Kegel exercises a part of your regular exercise routine. This information is not intended to replace advice given to you by your health care provider. Make sure you discuss any questions you have with your health care provider. Document Revised: 09/26/2020 Document Reviewed: 09/26/2020 Elsevier Patient Education  2024 [employer].  EXERCISE AND DIET:  We recommended that you start or continue a regular exercise program for good health. Regular exercise means any activity that makes your heart beat faster and makes you sweat.  We recommend exercising at least 30 minutes per day at least 3 days a week, preferably 4 or 5.  We also recommend a diet low in fat and sugar.  Inactivity, poor dietary choices and obesity can cause diabetes, heart attack, stroke, and kidney damage, among others.    ALCOHOL AND SMOKING:  Women should limit their alcohol intake to no more than 7 drinks/beers/glasses of wine (combined, not each!) per week. Moderation of alcohol intake to this level decreases your risk of breast cancer and liver damage. And of course, no recreational drugs are part of a healthy lifestyle.  And absolutely no smoking or even second hand smoke. Most people know smoking can cause heart and lung diseases, but did you know it also contributes to weakening of your bones? Aging of your skin?  Yellowing of your teeth and nails?  CALCIUM AND VITAMIN D:  Adequate intake of calcium and Vitamin D are recommended.  The recommendations for exact amounts of  these supplements seem to change often, but generally speaking 600 mg of calcium (either carbonate or citrate) and 800 units of Vitamin D per day seems prudent. Certain women may benefit from higher intake of Vitamin D.  If you are among these women, your doctor will have told you during your visit.    PAP SMEARS:  Pap  smears, to check for cervical cancer or precancers,  have traditionally been done yearly, although recent scientific advances have shown that most women can have pap smears less often.  However, every woman still should have a physical exam from her gynecologist every year. It will include a breast check, inspection of the vulva and vagina to check for abnormal growths or skin changes, a visual exam of the cervix, and then an exam to evaluate the size and shape of the uterus and ovaries.  And after 63 years of age, a rectal exam is indicated to check for rectal cancers. We will also provide age appropriate advice regarding health maintenance, like when you should have certain vaccines, screening for sexually transmitted diseases, bone density testing, colonoscopy, mammograms, etc.   MAMMOGRAMS:  All women over 7 years old should have a yearly mammogram. Many facilities now offer a 3D mammogram, which may cost around $50 extra out of pocket. If possible,  we recommend you accept the option to have the 3D mammogram performed.  It both reduces the number of women who will be called back for extra views which then turn out to be normal, and it is better than the routine mammogram at detecting truly abnormal areas.    COLONOSCOPY:  Colonoscopy to screen for colon cancer is recommended for all women at age 48.  We know, you hate the idea of the prep.  We agree, BUT, having colon cancer and not knowing it is worse!!  Colon cancer so often starts as a polyp that can be seen and removed at colonscopy, which can quite literally save your life!  And if your first colonoscopy is normal and you have no family history of colon cancer, most women don't have to have it again for 10 years.  Once every ten years, you can do something that may end up saving your life, right?  We will be happy to help you get it scheduled when you are ready.  Be sure to check your insurance coverage so you understand how much it will cost.  It  may be covered as a preventative service at no cost, but you should check your particular policy.

## 2024-03-13 ENCOUNTER — Ambulatory Visit: Admitting: Anesthesiology

## 2024-03-13 ENCOUNTER — Ambulatory Visit
Admission: RE | Admit: 2024-03-13 | Discharge: 2024-03-13 | Disposition: A | Discharge status: Home / Self Care | Attending: Gastroenterology | Admitting: Gastroenterology

## 2024-03-13 ENCOUNTER — Other Ambulatory Visit: Payer: Self-pay

## 2024-03-13 ENCOUNTER — Encounter: Payer: Self-pay | Admitting: Gastroenterology

## 2024-03-13 ENCOUNTER — Encounter
Admission: RE | Disposition: A | Discharge status: Home / Self Care | Payer: Self-pay | Source: Home / Self Care | Attending: Gastroenterology

## 2024-03-13 DIAGNOSIS — Z833 Family history of diabetes mellitus: Secondary | ICD-10-CM | POA: Insufficient documentation

## 2024-03-13 DIAGNOSIS — E063 Autoimmune thyroiditis: Secondary | ICD-10-CM | POA: Insufficient documentation

## 2024-03-13 DIAGNOSIS — K2100 Gastro-esophageal reflux disease with esophagitis, without bleeding: Secondary | ICD-10-CM | POA: Insufficient documentation

## 2024-03-13 DIAGNOSIS — I10 Essential (primary) hypertension: Secondary | ICD-10-CM | POA: Diagnosis not present

## 2024-03-13 DIAGNOSIS — Z7984 Long term (current) use of oral hypoglycemic drugs: Secondary | ICD-10-CM | POA: Diagnosis not present

## 2024-03-13 DIAGNOSIS — E119 Type 2 diabetes mellitus without complications: Secondary | ICD-10-CM | POA: Diagnosis not present

## 2024-03-13 DIAGNOSIS — Z79890 Hormone replacement therapy (postmenopausal): Secondary | ICD-10-CM | POA: Diagnosis not present

## 2024-03-13 HISTORY — PX: ESOPHAGOGASTRODUODENOSCOPY: SHX5428

## 2024-03-13 LAB — GLUCOSE, CAPILLARY: Glucose-Capillary: 108 mg/dL — ABNORMAL HIGH (ref 70–99)

## 2024-03-13 SURGERY — EGD (ESOPHAGOGASTRODUODENOSCOPY)
Anesthesia: General

## 2024-03-13 MED ORDER — GLYCOPYRROLATE 0.2 MG/ML IJ SOLN
INTRAMUSCULAR | Status: AC
  Filled 2024-03-13: qty 1

## 2024-03-13 MED ORDER — LIDOCAINE HCL (CARDIAC) PF 100 MG/5ML IV SOSY
PREFILLED_SYRINGE | INTRAVENOUS | Status: DC | PRN
  Administered 2024-03-13: 60 mg via INTRAVENOUS

## 2024-03-13 MED ORDER — DEXMEDETOMIDINE HCL IN NACL 80 MCG/20ML IV SOLN
INTRAVENOUS | Status: DC | PRN
  Administered 2024-03-13: 8 ug via INTRAVENOUS
  Administered 2024-03-13: 12 ug via INTRAVENOUS

## 2024-03-13 MED ORDER — PROPOFOL 10 MG/ML IV BOLUS
INTRAVENOUS | Status: DC | PRN
  Administered 2024-03-13 (×2): 40 mg via INTRAVENOUS

## 2024-03-13 MED ORDER — SODIUM CHLORIDE 0.9 % IV SOLN: INTRAVENOUS | Status: DC

## 2024-03-13 MED ORDER — PROPOFOL 500 MG/50ML IV EMUL
INTRAVENOUS | Status: DC | PRN
  Administered 2024-03-13: 75 ug/kg/min via INTRAVENOUS

## 2024-03-13 MED ORDER — LIDOCAINE HCL (PF) 2 % IJ SOLN
INTRAMUSCULAR | Status: AC
  Filled 2024-03-13: qty 5

## 2024-03-13 MED ORDER — PROPOFOL 1000 MG/100ML IV EMUL
INTRAVENOUS | Status: AC
  Filled 2024-03-13: qty 100

## 2024-03-13 NOTE — Anesthesia Postprocedure Evaluation (Signed)
 Anesthesia Post Note  Patient: ARHIANNA EBEY  Procedure(s) Performed: EGD (ESOPHAGOGASTRODUODENOSCOPY)  Patient location during evaluation: Endoscopy Anesthesia Type: General Level of consciousness: awake and alert Pain management: pain level controlled Vital Signs Assessment: post-procedure vital signs reviewed and stable Respiratory status: spontaneous breathing, nonlabored ventilation, respiratory function stable and patient connected to nasal cannula oxygen Cardiovascular status: blood pressure returned to baseline and stable Postop Assessment: no apparent nausea or vomiting Anesthetic complications: no   No notable events documented.   Last Vitals:  Vitals:   03/13/24 0832 03/13/24 0842  BP: 105/69 112/73  Pulse: 72 72  Resp: 16 (!) 22  Temp:    SpO2: 99% 100%    Last Pain:  Vitals:   03/13/24 0842  TempSrc:   PainSc: 0-No pain                 Lendia LITTIE Mae

## 2024-03-13 NOTE — Transfer of Care (Signed)
 Immediate Anesthesia Transfer of Care Note  Patient: BLONDELL LAPERLE  Procedure(s) Performed: EGD (ESOPHAGOGASTRODUODENOSCOPY)  Patient Location: PACU  Anesthesia Type:General  Level of Consciousness: sedated  Airway & Oxygen Therapy: Patient Spontanous Breathing  Post-op Assessment: Report given to RN and Post -op Vital signs reviewed and stable  Post vital signs: Reviewed and stable  Last Vitals:  Vitals Value Taken Time  BP 97/61 03/13/24 08:22  Temp 35.9 C 03/13/24 08:22  Pulse 68 03/13/24 08:25  Resp 13 03/13/24 08:25  SpO2 98 % 03/13/24 08:25  Vitals shown include unfiled device data.  Last Pain:  Vitals:   03/13/24 0822  TempSrc: Temporal  PainSc: Asleep         Complications: No notable events documented.

## 2024-03-13 NOTE — Op Note (Signed)
 [facility] Gastroenterology Patient Name: Carmen [name] Procedure Date: 03/13/2024 8:10 AM MRN: [id number] Account #: [id number] Date of Birth: [birth year] Admit Type: Outpatient Age: 63 Room: [facility] ENDO ROOM 1 Gender: Female Note Status: Finalized Instrument Name: Barnie GI Scope [id number] Procedure:             Upper GI endoscopy Indications:           Follow-up of gastro-esophageal reflux disease Providers:             Ruel Kung MD, MD Referring MD:          No Local Md, MD (Referring MD) Medicines:             Monitored Anesthesia Care Complications:         No immediate complications. Procedure:             Pre-Anesthesia Assessment:                        - Prior to the procedure, a History and Physical was                         performed, and patient medications, allergies and                         sensitivities were reviewed. The patient's tolerance                         of previous anesthesia was reviewed.                        - The risks and benefits of the procedure and the                         sedation options and risks were discussed with the                         patient. All questions were answered and informed                         consent was obtained.                        - ASA Grade Assessment: II - A patient with mild                         systemic disease.                        After obtaining informed consent, the endoscope was                         passed under direct vision. Throughout the procedure,                         the patient's blood pressure, pulse, and oxygen                         saturations were monitored continuously. The Endoscope  was introduced through the mouth, and advanced to the                         third part of duodenum. The upper GI endoscopy was                         accomplished with ease. The patient tolerated the                         procedure  well. Findings:      The esophagus was normal.      The stomach was normal.      The examined duodenum was normal. Impression:            - Normal esophagus.                        - Normal stomach.                        - Normal examined duodenum.                        - No specimens collected. Recommendation:        - Discharge patient to home (with escort).                        - Resume previous diet.                        - Continue present medications.                        - Return to GI office as previously scheduled. Procedure Code(s):     --- Professional ---                        [phone], Esophagogastroduodenoscopy, flexible,                         transoral; diagnostic, including collection of                         specimen(s) by brushing or washing, when performed                         (separate procedure) Diagnosis Code(s):     --- Professional ---                        K21.9, Gastro-esophageal reflux disease without                         esophagitis CPT copyright 2022 American Medical Association. All rights reserved. The codes documented in this report are preliminary and upon coder review may  be revised to meet current compliance requirements. Ruel Kung, MD Ruel Kung MD, MD 03/13/2024 8:19:35 AM This report has been signed electronically. Number of Addenda: 0 Note Initiated On: 03/13/2024 8:10 AM Estimated Blood Loss:  Estimated blood loss: none.      [facility]

## 2024-03-13 NOTE — Anesthesia Preprocedure Evaluation (Signed)
 Anesthesia Evaluation  Patient identified by MRN, date of birth, ID band Patient awake    Reviewed: Allergy & Precautions, NPO status , Patient's Chart, lab work & pertinent test results  History of Anesthesia Complications Negative for: history of anesthetic complications  Airway Mallampati: II  TM Distance: >3 FB Neck ROM: full    Dental no notable dental hx.    Pulmonary neg pulmonary ROS   Pulmonary exam normal        Cardiovascular hypertension, On Medications negative cardio ROS Normal cardiovascular exam     Neuro/Psych negative neurological ROS  negative psych ROS   GI/Hepatic Neg liver ROS,GERD  Medicated,,  Endo/Other  diabetesHypothyroidism    Renal/GU negative Renal ROS  negative genitourinary   Musculoskeletal   Abdominal   Peds  Hematology negative hematology ROS (+)   Anesthesia Other Findings Past Medical History: 2008: Blood sugar increased No date: Cystocele No date: Diabetes (HCC) No date: Fibroid 2005: Hx of basal cell carcinoma     Comment:  scalp  08/02/2007: 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 adenomyosis               fibroid     Reproductive/Obstetrics negative OB ROS                              Anesthesia Physical Anesthesia Plan  ASA:  3  Anesthesia Plan: General   Post-op Pain Management: Minimal or no pain anticipated   Induction: Intravenous  PONV Risk Score and Plan: 2 and Propofol  infusion and TIVA  Airway Management Planned: Natural Airway and Nasal Cannula  Additional Equipment:   Intra-op Plan:   Post-operative Plan:   Informed Consent: I have reviewed the patients History and Physical, chart, labs and discussed the procedure including the risks, benefits and alternatives for the proposed anesthesia with the patient or authorized representative who has indicated his/her understanding and acceptance.     Dental Advisory Given  Plan Discussed with: Anesthesiologist, CRNA and Surgeon  Anesthesia Plan Comments: (Patient consented for risks of anesthesia including but not limited to:  - adverse reactions to medications - risk of airway placement if required - damage to eyes, teeth, lips or other oral mucosa - nerve damage due to positioning  - sore throat or hoarseness - Damage to heart, brain, nerves, lungs, other parts of body or loss of life  Patient voiced understanding and assent.)        Anesthesia Quick Evaluation

## 2024-03-13 NOTE — H&P (Signed)
 Ruel Kung , MD [address], [place], [place], [address] Phone: [phone] Fax: [phone]  Primary Care Physician:  Stanton Lynwood FALCON, MD   Pre-Procedure History & Physical: HPI:  Carmen [name] is a 63 y.o. female is here for an endoscopy    Past Medical History:  Diagnosis Date   Blood sugar increased 2008   Cystocele    Diabetes (HCC)    Fibroid    Hx of basal cell carcinoma 2005   scalp    Hx of squamous cell carcinoma 08/02/2007   Left post thigh   Hyperlipidemia    Hypertension    Hypothyroid    Hashimoto's  thyroid    Reflux esophagitis    Urinary incontinence    Uterine prolapse    Uterine prolapse     Past Surgical History:  Procedure Laterality Date   BILATERAL SALPINGECTOMY     COLONOSCOPY WITH PROPOFOL  N/A 09/16/2020   Procedure: COLONOSCOPY WITH PROPOFOL ;  Surgeon: Janalyn Keene NOVAK, MD;  Location: [facility] ENDOSCOPY;  Service: Endoscopy;  Laterality: N/A;   VAGINAL HYSTERECTOMY  11/08/2007   Total; Cystocele repair; valut suspension. adenomyosis fibroid    Prior to Admission medications   Medication Sig Start Date End Date Taking? Authorizing Provider  Calcium Carbonate (CALTRATE 600 PO) Take 600 mg by mouth daily.   Yes [provider]  fexofenadine (ALLEGRA) 180 MG tablet Take 180 mg by mouth daily.   Yes [provider]  levothyroxine (SYNTHROID, LEVOTHROID) 50 MCG tablet Take 50 mcg by mouth daily before breakfast.   Yes [provider]  losartan (COZAAR) 50 MG tablet Take 50 mg by mouth daily.   Yes [provider]  metFORMIN (GLUCOPHAGE) 500 MG tablet Take 500 mg by mouth 2 (two) times daily with a meal.   Yes [provider]  Multiple Vitamins-Minerals (MULTIVITAMIN PO) Take by mouth.   Yes [provider]  omeprazole (PRILOSEC) 40 MG capsule Take 40 mg by mouth. 11/23/23 11/22/24 Yes [provider]  pravastatin (PRAVACHOL) 10 MG tablet Take 1 tablet by mouth 3  (three) times a week. 12/10/21 03/13/24 Yes [provider]  PREVIDENT 5000 BOOSTER PLUS 1.1 % PSTE USE UTD QD 12/11/18  Yes [provider]  triamterene-hydrochlorothiazide (MAXZIDE-25) 37.5-25 MG per tablet Take 1 tablet by mouth daily.   Yes [provider]  VASCEPA 1 g capsule Take 2 g by mouth 2 (two) times daily. 01/01/21  Yes [provider]  BAYER CONTOUR NEXT TEST test strip TEST BLOOD SUGAR QD UTD 09/05/14   [provider]  Clindamycin  Phosphate foam Apply once daily to affected areas 12/07/23   Jackquline Sawyer, MD  Microlet Lancets MISC 2 (two) times daily E11.9 09/01/17   [provider]  Misc Natural Products (ESTROVEN + ENERGY MAX STRENGTH PO) Take by mouth. Patient not taking: Reported on 01/13/2024    [provider]  sucralfate (CARAFATE) 1 GM/10ML suspension Take 1 g by mouth. Patient not taking: Reported on 01/13/2024 11/23/23 11/22/24  [provider]  tretinoin  (RETIN-A ) 0.05 % cream Apply topically at bedtime. Start with pea size amount every other night and increase to nightly as tolerated. 12/07/23 12/06/24  Jackquline Sawyer, MD  triamcinolone  cream (KENALOG ) 0.1 % Apply 1 application. topically 2 (two) times daily as needed. Patient not taking: Reported on 01/13/2024 11/03/21   Jackquline Sawyer, MD    Allergies as of 02/26/2024 - Review Complete 01/13/2024  Allergen Reaction Noted   Atenolol Swelling  11/23/2002   Latex Itching 11/28/2002   Rosuvastatin Other (See Comments) 12/10/2021    Family History  Problem Relation Age of Onset   Thyroid  disease Mother    Osteoporosis Mother    Diabetes Father    Hypertension Father    Stroke Father    Colon cancer Other    Breast cancer Neg Hx     Social History   Socioeconomic History   Marital status: Married    Spouse name: Not on file   Number of children: Not on file   Years of education: Not on file   Highest education level: Not on file  Occupational History    Not on file  Tobacco Use   Smoking status: Never   Smokeless tobacco: Never  Vaping Use   Vaping status: Never Used  Substance and Sexual Activity   Alcohol use: No    Alcohol/week: 0.0 standard drinks of alcohol   Drug use: No   Sexual activity: Yes    Partners: Male    Birth control/protection: Surgical    Comment: Hysterectomy/first intercourse >16, less than 5 partnes  Other Topics Concern   Not on file  Social History Narrative   Not on file   Social Drivers of Health   Financial Resource Strain: Low Risk  (02/22/2024)   Received [facility]   Overall Financial Resource Strain (CARDIA)    Difficulty of Paying Living Expenses: Not hard at all  Food Insecurity: No Food Insecurity (02/22/2024)   Received from [facility]   Hunger Vital Sign    Within the past 12 months, you worried that your food would run out before you got the money to buy more.: Never true    Within the past 12 months, the food you bought just didn't last and you didn't have money to get more.: Never true  Transportation Needs: No Transportation Needs (02/22/2024)   Received from [facility] - Transportation    In the past 12 months, has lack of transportation kept you from medical appointments or from getting medications?: No    Lack of Transportation (Non-Medical): No  Physical Activity: Not on file  Stress: Not on file  Social Connections: Not on file  Intimate Partner Violence: Not on file    Review of Systems: See HPI, otherwise negative ROS  Physical Exam: BP 139/80   Pulse 79   Temp (!) 96.7 F (35.9 C) (Temporal)   Resp 16   Ht 5' 4 (1.626 m)   Wt 60.8 kg   SpO2 100%   BMI 23.00 kg/m  General:   Alert,  pleasant and cooperative in NAD Head:  Normocephalic and atraumatic. Neck:  Supple; no masses or thyromegaly. Lungs:  Clear throughout to auscultation, normal respiratory effort.    Heart:  +S1, +S2, Regular rate  and rhythm, No edema. Abdomen:  Soft, nontender and nondistended. Normal bowel sounds, without guarding, and without rebound.   Neurologic:  Alert and  oriented x4;  grossly normal neurologically.  Impression/Plan: Carmen [name] is here for an endoscopy  to be performed for  evaluation of GERD    Risks, benefits, limitations, and alternatives regarding endoscopy have been reviewed with the patient.  Questions have been answered.  All parties agreeable.   Ruel Kung, MD  03/13/2024, 8:10 AM

## 2024-05-15 ENCOUNTER — Other Ambulatory Visit: Payer: Self-pay | Admitting: Family Medicine

## 2024-05-15 DIAGNOSIS — Z1231 Encounter for screening mammogram for malignant neoplasm of breast: Secondary | ICD-10-CM

## 2024-06-28 ENCOUNTER — Encounter

## 2024-12-19 ENCOUNTER — Encounter: Admitting: Dermatology

## 2025-01-15 ENCOUNTER — Ambulatory Visit: Admitting: Obstetrics and Gynecology
